# Patient Record
Sex: Male | Born: 1970 | Race: White | Hispanic: No | Marital: Married | Smoking: Never smoker
Health system: Southern US, Community
[De-identification: ages and names within clinical notes are randomized; demographics above are authoritative.]

## PROBLEM LIST (undated history)

## (undated) DIAGNOSIS — I7300 Raynaud's syndrome without gangrene: Secondary | ICD-10-CM

## (undated) DIAGNOSIS — I10 Essential (primary) hypertension: Secondary | ICD-10-CM

## (undated) DIAGNOSIS — I071 Rheumatic tricuspid insufficiency: Secondary | ICD-10-CM

## (undated) DIAGNOSIS — R002 Palpitations: Secondary | ICD-10-CM

## (undated) HISTORY — DX: Essential (primary) hypertension: I10

## (undated) HISTORY — DX: Palpitations: R00.2

## (undated) HISTORY — DX: Rheumatic tricuspid insufficiency: I07.1

## (undated) HISTORY — DX: Raynaud's syndrome without gangrene: I73.00

---
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
## 1999-01-20 ENCOUNTER — Emergency Department (HOSPITAL_COMMUNITY): Admission: EM | Admit: 1999-01-20 | Discharge: 1999-01-20 | Payer: Self-pay | Admitting: Emergency Medicine

## 2017-01-22 ENCOUNTER — Ambulatory Visit (INDEPENDENT_AMBULATORY_CARE_PROVIDER_SITE_OTHER): Payer: BLUE CROSS/BLUE SHIELD | Admitting: Physician Assistant

## 2017-01-22 ENCOUNTER — Encounter: Payer: Self-pay | Admitting: Physician Assistant

## 2017-01-22 VITALS — BP 138/90 | HR 80 | Temp 97.6°F | Ht 68.0 in | Wt 139.2 lb

## 2017-01-22 DIAGNOSIS — F418 Other specified anxiety disorders: Secondary | ICD-10-CM | POA: Diagnosis not present

## 2017-01-22 DIAGNOSIS — I10 Essential (primary) hypertension: Secondary | ICD-10-CM | POA: Insufficient documentation

## 2017-01-22 NOTE — Patient Instructions (Signed)
It was great meeting you today!  Please follow-up with me in two weeks, sooner if needed.  Go to the ER if you develop any chest pain or shortness of breath.   Hypertension Hypertension, commonly called high blood pressure, is when the force of blood pumping through your arteries is too strong. Your arteries are the blood vessels that carry blood from your heart throughout your body. A blood pressure reading consists of a higher number over a lower number, such as 110/72. The higher number (systolic) is the pressure inside your arteries when your heart pumps. The lower number (diastolic) is the pressure inside your arteries when your heart relaxes. Ideally you want your blood pressure below 120/80. Hypertension forces your heart to work harder to pump blood. Your arteries may become narrow or stiff. Having untreated or uncontrolled hypertension can cause heart attack, stroke, kidney disease, and other problems. What increases the risk? Some risk factors for high blood pressure are controllable. Others are not. Risk factors you cannot control include:  Race. You may be at higher risk if you are African American.  Age. Risk increases with age.  Gender. Men are at higher risk than women before age 85 years. After age 65, women are at higher risk than men. Risk factors you can control include:  Not getting enough exercise or physical activity.  Being overweight.  Getting too much fat, sugar, calories, or salt in your diet.  Drinking too much alcohol. What are the signs or symptoms? Hypertension does not usually cause signs or symptoms. Extremely high blood pressure (hypertensive crisis) may cause headache, anxiety, shortness of breath, and nosebleed. How is this diagnosed? To check if you have hypertension, your health care provider will measure your blood pressure while you are seated, with your arm held at the level of your heart. It should be measured at least twice using the same arm.  Certain conditions can cause a difference in blood pressure between your right and left arms. A blood pressure reading that is higher than normal on one occasion does not mean that you need treatment. If it is not clear whether you have high blood pressure, you may be asked to return on a different day to have your blood pressure checked again. Or, you may be asked to monitor your blood pressure at home for 1 or more weeks. How is this treated? Treating high blood pressure includes making lifestyle changes and possibly taking medicine. Living a healthy lifestyle can help lower high blood pressure. You may need to change some of your habits. Lifestyle changes may include:  Following the DASH diet. This diet is high in fruits, vegetables, and whole grains. It is low in salt, red meat, and added sugars.  Keep your sodium intake below 2,300 mg per day.  Getting at least 30-45 minutes of aerobic exercise at least 4 times per week.  Losing weight if necessary.  Not smoking.  Limiting alcoholic beverages.  Learning ways to reduce stress. Your health care provider may prescribe medicine if lifestyle changes are not enough to get your blood pressure under control, and if one of the following is true:  You are 53-27 years of age and your systolic blood pressure is above 140.  You are 38 years of age or older, and your systolic blood pressure is above 150.  Your diastolic blood pressure is above 90.  You have diabetes, and your systolic blood pressure is over 140 or your diastolic blood pressure is over 90.  You have kidney disease and your blood pressure is above 140/90.  You have heart disease and your blood pressure is above 140/90. Your personal target blood pressure may vary depending on your medical conditions, your age, and other factors. Follow these instructions at home:  Have your blood pressure rechecked as directed by your health care provider.  Take medicines only as directed by  your health care provider. Follow the directions carefully. Blood pressure medicines must be taken as prescribed. The medicine does not work as well when you skip doses. Skipping doses also puts you at risk for problems.  Do not smoke.  Monitor your blood pressure at home as directed by your health care provider. Contact a health care provider if:  You think you are having a reaction to medicines taken.  You have recurrent headaches or feel dizzy.  You have swelling in your ankles.  You have trouble with your vision. Get help right away if:  You develop a severe headache or confusion.  You have unusual weakness, numbness, or feel faint.  You have severe chest or abdominal pain.  You vomit repeatedly.  You have trouble breathing. This information is not intended to replace advice given to you by your health care provider. Make sure you discuss any questions you have with your health care provider.

## 2017-01-22 NOTE — Assessment & Plan Note (Signed)
Currently well controlled on Lisinopril 40 mg, which he recently started a few days ago. Requesting recent labs and EKG that were performed at [facility]. Follow-up with me in 2 weeks for follow-up of BP and physical. Will check labs at that time. Sooner if needed. Advised patient to ER if any chest pain or new/worrisome symptoms.

## 2017-01-22 NOTE — Progress Notes (Addendum)
Subjective:    Patient ID: Jonathan Mcguire, male    DOB: Apr 22, 1971, 46 y.o.   MRN: [id number]  HPI  Jonathan Mcguire is a 46 y/o male who presents to clinic today to establish care.  Acute Concerns: HTN - 3-weeks ago he noticed some episodes where he wasn't feeling well -- heart palpitations, lightheaded, dizziness went to walk-in clinic about 1 week ago, BP was 160/90, EKG and blood work was normal; he began to feel this way again a few days ago, and was started on Lisinopril 20 mg by a physician friend, he was told to increase to 40 mg if he still had symptoms which he did yesterday. He bought a blood pressure cuff and has been monitoring his Bp. Since he has been on the dose of 40mg  Lisinopril he has had well-controlled BP of <140/90.  He is with his wife today. 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 He does endorse a history of "white coat hypertension." He describes his sleep as "okay" he has always he has been a light sleeper.   Diet -- healthy and well-balanced diet, 1 cup of coffee daily Exercise -- walk 2-3 days, for about 2-2.5 hours Weight --   140 lb -- stable here Mood -- "concerned about my health"  He denies SOB, chest pain, headaches, lower leg swelling, poor appetite, sweating.  Review of Systems  See HPI  History reviewed. No pertinent past medical history.   Social History   Social History  . Marital status: Married    Spouse name: N/A  . Number of children: N/A  . Years of education: N/A   Occupational History  . Not on file.   Social History Main Topics  . Smoking status: Never Smoker  . Smokeless tobacco: Never Used  . Alcohol use 1.8 - 3.0 oz/week    2 - 3 Cans of beer, 1 - 2 Glasses of wine per week  . Drug use: No  . Sexual activity: Yes   Other Topics Concern  . Not on file   Social History Narrative  . No narrative on file    Past  Surgical History:  Procedure Laterality Date  . FRACTURE SURGERY Left 1987   Left leg, compound fx    Family History  Problem Relation Age of Onset  . Hypertension Mother   . Cancer Father   . Cancer Maternal Grandmother   . Heart disease Maternal Grandfather   . Stroke Maternal Grandfather     No Known Allergies  No current outpatient prescriptions on file prior to visit.   No current facility-administered medications on file prior to visit.     BP 138/90 (BP Location: Left Arm, Cuff Size: Normal)   Pulse 80   Temp 97.6 F (36.4 C) (Oral)   Ht 5\' 8"  (1.727 m)   Wt 139 lb 4 oz (63.2 kg)   SpO2 95%   BMI 21.17 kg/m      Objective:   Physical Exam  Constitutional: He appears well-developed and well-nourished. He is cooperative.  Non-toxic appearance. He does not have a sickly appearance. He does not appear ill. No distress.  HENT:  Head: Normocephalic and atraumatic.  Neck: Carotid bruit is not present.  Cardiovascular: Normal rate, regular rhythm, normal heart sounds and normal pulses.   Pulmonary/Chest: Effort normal. No accessory muscle usage. No respiratory distress.  Lymphadenopathy:  He has no cervical adenopathy.  Neurological: He is alert.  Skin: Skin is warm, dry and intact.  Nursing note and vitals reviewed.     Assessment & Plan:   Problem List Items Addressed This Visit      Cardiovascular and Mediastinum   Hypertension - Primary    Currently well controlled on Lisinopril 40 mg, which he recently started a few days ago. Requesting recent labs and EKG that were performed at [facility]. Follow-up with me in 2 weeks for follow-up of BP and physical. Will check labs at that time. Sooner if needed. Advised patient to ER if any chest pain or new/worrisome symptoms.      Relevant Medications   lisinopril (PRINIVIL,ZESTRIL) 20 MG tablet     Jarold MottoSamantha Arad Burston PA-C 01/22/17

## 2017-01-22 NOTE — Progress Notes (Signed)
Pre visit review using our clinic review tool, if applicable. No additional management support is needed unless otherwise documented below in the visit note. 

## 2017-01-25 ENCOUNTER — Encounter: Payer: Self-pay | Admitting: Physician Assistant

## 2017-01-25 ENCOUNTER — Ambulatory Visit: Payer: BLUE CROSS/BLUE SHIELD | Admitting: Physician Assistant

## 2017-01-25 ENCOUNTER — Emergency Department (HOSPITAL_COMMUNITY)
Admission: EM | Admit: 2017-01-25 | Discharge: 2017-01-25 | Disposition: A | Discharge status: Home / Self Care | Payer: BLUE CROSS/BLUE SHIELD | Attending: Emergency Medicine | Admitting: Emergency Medicine

## 2017-01-25 ENCOUNTER — Encounter (HOSPITAL_COMMUNITY): Payer: Self-pay | Admitting: Emergency Medicine

## 2017-01-25 ENCOUNTER — Emergency Department (HOSPITAL_COMMUNITY): Payer: BLUE CROSS/BLUE SHIELD

## 2017-01-25 VITALS — BP 160/94 | HR 114

## 2017-01-25 DIAGNOSIS — I10 Essential (primary) hypertension: Secondary | ICD-10-CM | POA: Diagnosis not present

## 2017-01-25 DIAGNOSIS — R000 Tachycardia, unspecified: Secondary | ICD-10-CM | POA: Diagnosis not present

## 2017-01-25 DIAGNOSIS — Z79899 Other long term (current) drug therapy: Secondary | ICD-10-CM | POA: Insufficient documentation

## 2017-01-25 LAB — CBC
HEMATOCRIT: 46.7 % (ref 39.0–52.0)
HEMOGLOBIN: 17.2 g/dL — AB (ref 13.0–17.0)
MCH: 29.1 pg (ref 26.0–34.0)
MCHC: 36.8 g/dL — ABNORMAL HIGH (ref 30.0–36.0)
MCV: 79 fL (ref 78.0–100.0)
Platelets: 217 10*3/uL (ref 150–400)
RBC: 5.91 MIL/uL — AB (ref 4.22–5.81)
RDW: 12.1 % (ref 11.5–15.5)
WBC: 7 10*3/uL (ref 4.0–10.5)

## 2017-01-25 LAB — BASIC METABOLIC PANEL
ANION GAP: 10 (ref 5–15)
BUN: 20 mg/dL (ref 6–20)
CO2: 22 mmol/L (ref 22–32)
Calcium: 9.9 mg/dL (ref 8.9–10.3)
Chloride: 103 mmol/L (ref 101–111)
Creatinine, Ser: 1.13 mg/dL (ref 0.61–1.24)
GFR calc Af Amer: >60 mL/min (ref >60)
Glucose, Bld: 116 mg/dL — ABNORMAL HIGH (ref 65–99)
POTASSIUM: 3.9 mmol/L (ref 3.5–5.1)
SODIUM: 135 mmol/L (ref 135–145)

## 2017-01-25 LAB — URINALYSIS, ROUTINE W REFLEX MICROSCOPIC
Bilirubin Urine: NEGATIVE
GLUCOSE, UA: NEGATIVE mg/dL
Hgb urine dipstick: NEGATIVE
KETONES UR: 5 mg/dL — AB
LEUKOCYTES UA: NEGATIVE
Nitrite: NEGATIVE
PROTEIN: NEGATIVE mg/dL
Specific Gravity, Urine: 1.019 (ref 1.005–1.030)
pH: 7 (ref 5.0–8.0)

## 2017-01-25 LAB — RAPID URINE DRUG SCREEN, HOSP PERFORMED
Amphetamines: NOT DETECTED
BENZODIAZEPINES: NOT DETECTED
Barbiturates: NOT DETECTED
Cocaine: NOT DETECTED
Opiates: NOT DETECTED
Tetrahydrocannabinol: NOT DETECTED

## 2017-01-25 LAB — I-STAT TROPONIN, ED: Troponin i, poc: 0 ng/mL (ref 0.00–0.08)

## 2017-01-25 LAB — TSH: TSH: 2.831 u[IU]/mL (ref 0.350–4.500)

## 2017-01-25 MED ORDER — METOPROLOL TARTRATE 5 MG/5ML IV SOLN
5.0000 mg | Freq: Once | INTRAVENOUS | Status: AC
  Administered 2017-01-25: 5 mg via INTRAVENOUS
  Filled 2017-01-25: qty 5

## 2017-01-25 MED ORDER — SODIUM CHLORIDE 0.9 % IV BOLUS (SEPSIS)
1000.0000 mL | Freq: Once | INTRAVENOUS | Status: AC
  Administered 2017-01-25: 1000 mL via INTRAVENOUS

## 2017-01-25 MED ORDER — METOPROLOL SUCCINATE ER 25 MG PO TB24: 25.0000 mg | ORAL_TABLET | Freq: Every day | ORAL | 0 refills | Status: DC

## 2017-01-25 NOTE — ED Provider Notes (Signed)
[facility]-EMERGENCY DEPT Provider Note   CSN: [id number] Arrival date & time: 01/25/17  1119     History   Chief Complaint Chief Complaint  Patient presents with  . Tachycardia    HPI Jonathan Mcguire is a 46 y.o. male.  Pt presents to the ED today with high blood pressure and tachycardia.  The pt said he started feeling bad about 1 week ago.  He was found to have elevated bp.  The pt was rx'd lisinopril 20 mg.  His bp remained elevated, so he increased the does to bid.  The pt had been doing well, then his bp elevated again.  He has also had rapid hr.  He said he had to leave work which is very unusual for him. Pt denied taking any otc meds.  Pt denies cp.      History reviewed. No pertinent past medical history.  Patient Active Problem List   Diagnosis Date Noted  . Hypertension 01/22/2017    Past Surgical History:  Procedure Laterality Date  . FRACTURE SURGERY Left 1987   Left leg, compound fx  . TONSILLECTOMY Bilateral 1980       Home Medications    Prior to Admission medications   Medication Sig Start Date End Date Taking? Authorizing Provider  lisinopril (PRINIVIL,ZESTRIL) 20 MG tablet  01/20/17   Historical Provider, MD  metoprolol succinate (TOPROL-XL) 25 MG 24 hr tablet Take 1 tablet (25 mg total) by mouth daily. 01/25/17   Jacalyn Lefevre, MD    Family History Family History  Problem Relation Age of Onset  . Hypertension Mother   . Cancer Father   . Cancer Maternal Grandmother   . Heart disease Maternal Grandfather   . Stroke Maternal Grandfather     Social History Social History  Substance Use Topics  . Smoking status: Never Smoker  . Smokeless tobacco: Never Used  . Alcohol use 1.8 - 3.0 oz/week    2 - 3 Cans of beer, 1 - 2 Glasses of wine per week     Allergies   Patient has no known allergies.   Review of Systems Review of Systems  Cardiovascular: Positive for palpitations.  All other systems reviewed and are negative.    Physical  Exam Updated Vital Signs BP 148/93 (BP Location: Left Arm)   Pulse 77   Temp 97.7 F (36.5 C) (Oral)   Resp 14   Ht 5\' 8"  (1.727 m)   Wt 140 lb (63.5 kg)   SpO2 100%   BMI 21.29 kg/m   Physical Exam  Constitutional: He is oriented to person, place, and time. He appears well-developed and well-nourished.  HENT:  Head: Normocephalic and atraumatic.  Right Ear: External ear normal.  Left Ear: External ear normal.  Nose: Nose normal.  Mouth/Throat: Oropharynx is clear and moist.  Eyes: Conjunctivae and EOM are normal. Pupils are equal, round, and reactive to light.  Neck: Normal range of motion. Neck supple.  Cardiovascular: Regular rhythm, normal heart sounds and intact distal pulses.  Tachycardia present.   Pulmonary/Chest: Effort normal and breath sounds normal.  Abdominal: Soft. Bowel sounds are normal.  Musculoskeletal: Normal range of motion.  Neurological: He is alert and oriented to person, place, and time.  Skin: Skin is warm.  Psychiatric: He has a normal mood and affect. His behavior is normal. Judgment and thought content normal.  Nursing note and vitals reviewed.    ED Treatments / Results  Labs (all labs ordered are listed, but only abnormal results  are displayed) Labs Reviewed  BASIC METABOLIC PANEL - Abnormal; Notable for the following:       Result Value   Glucose, Bld 116 (*)    All other components within normal limits  CBC - Abnormal; Notable for the following:    RBC 5.91 (*)    Hemoglobin 17.2 (*)    MCHC 36.8 (*)    All other components within normal limits  URINALYSIS, ROUTINE W REFLEX MICROSCOPIC - Abnormal; Notable for the following:    Ketones, ur 5 (*)    All other components within normal limits  TSH  RAPID URINE DRUG SCREEN, HOSP PERFORMED  I-STAT TROPOININ, ED    EKG  EKG Interpretation  Date/Time:  Thursday January 25 2017 11:34:17 EST Ventricular Rate:  116 PR Interval:    QRS Duration: 85 QT Interval:  317 QTC  Calculation: 441 R Axis:   68 Text Interpretation:  Sinus tachycardia Right atrial enlargement Probable anteroseptal infarct, old No old tracing to compare Confirmed by [facility] South[facility]ILAND MD, Donold Marotto [id number]) on 01/25/2017 12:45:17 PM       Radiology Dg Chest 2 View  Result Date: 01/25/2017 CLINICAL DATA:  Tachycardia. EXAM: CHEST  2 VIEW COMPARISON:  No recent prior . FINDINGS: Mediastinum hilar structures normal. Lungs are clear. Heart size normal. No pleural effusion or pneumothorax. No acute bony abnormality. IMPRESSION: No acute cardiopulmonary disease. Electronically Signed   By: Maisie Fushomas  Register   On: 01/25/2017 12:47    Procedures Procedures (including critical care time)  Medications Ordered in ED Medications  sodium chloride 0.9 % bolus 1,000 mL (1,000 mLs Intravenous New Bag/Given 01/25/17 1322)  metoprolol (LOPRESSOR) injection 5 mg (5 mg Intravenous Given 01/25/17 1318)     Initial Impression / Assessment and Plan / ED Course  I have reviewed the triage vital signs and the nursing notes.  Pertinent labs & imaging results that were available during my care of the patient were reviewed by me and considered in my medical decision making (see chart for details).     Pt responded to lopressor.  Pt d/w Trish (cardiology) who will set pt up with a Holter monitor.  I also spoke with Dr. Katrinka BlazingSmith (cardiology) who recommends pt f/u in the office.   Lisinopril to be stopped.  Lopressor to start.  Final Clinical Impressions(s) / ED Diagnoses   Final diagnoses:  Sinus tachycardia  Essential hypertension    New Prescriptions New Prescriptions   METOPROLOL SUCCINATE (TOPROL-XL) 25 MG 24 HR TABLET    Take 1 tablet (25 mg total) by mouth daily.     Jacalyn LefevreJulie Loanne Emery, MD 01/25/17 1600

## 2017-01-25 NOTE — Progress Notes (Signed)
Pt walked into the office with his wife c/o shakiness, tachycardia since last night was unable to sleep due to could not calm down. Pt took his Bp this AM and it was 175/104 and not sure if feeling tingling in left arm. Pt said did take medication Lisinopril 20 mg today. Pt looks anxious and shakey. Discussed pt with Lelon MastSamantha and Dr. Earlene PlaterWallace, verbal order to send pt to the ED to be evaluated. Went back in room told wife need to take pt to the ED to be evaluated. Pt and wife verbalized understanding and will go to ED now.

## 2017-01-25 NOTE — Discharge Instructions (Addendum)
Cardiology will call you with a time for Holter Monitor set up.  Stop Lisinopril.

## 2017-01-25 NOTE — ED Triage Notes (Signed)
Pt was sent from PCP for further evaluation. Pt c/o tachycardia and tingling in left arm. Pt was evaluated a month ago for same feeling and was found to have hypertension. Pt denies any chest pain or SOB. Pt started lisinopril 40mg  Saturday.

## 2017-01-30 ENCOUNTER — Encounter: Payer: Self-pay | Admitting: Nurse Practitioner

## 2017-01-30 ENCOUNTER — Ambulatory Visit (INDEPENDENT_AMBULATORY_CARE_PROVIDER_SITE_OTHER): Payer: BLUE CROSS/BLUE SHIELD

## 2017-01-30 ENCOUNTER — Encounter: Payer: Self-pay | Admitting: Physician Assistant

## 2017-01-30 ENCOUNTER — Ambulatory Visit (INDEPENDENT_AMBULATORY_CARE_PROVIDER_SITE_OTHER): Payer: BLUE CROSS/BLUE SHIELD | Admitting: Nurse Practitioner

## 2017-01-30 VITALS — BP 165/99 | HR 66 | Ht 68.0 in | Wt 139.0 lb

## 2017-01-30 DIAGNOSIS — I10 Essential (primary) hypertension: Secondary | ICD-10-CM

## 2017-01-30 DIAGNOSIS — R002 Palpitations: Secondary | ICD-10-CM

## 2017-01-30 NOTE — Progress Notes (Addendum)
Cardiology Clinic Note   Patient Name: Jonathan Mcguire Date of Encounter: 01/30/2017  Primary Care Provider:  Jarold Motto, PA Primary Cardiologist:  New - will f/u D. Herbie Baltimore, MD   Patient Profile    46 year old male with a history of presumed whitecoat HTN, who presents for evaluation related to recently noted elevated blood pressures and heart rates.  Past Medical History    Past Medical History:  Diagnosis Date  . Essential hypertension   . Palpitations    Past Surgical History:  Procedure Laterality Date  . FRACTURE SURGERY Left 1987   Left leg, compound fx  . TONSILLECTOMY Bilateral 1980    Allergies  No Known Allergies  History of Present Illness    46 year old male with a history of elevated blood pressures dating back at least 10 years. He had been on ARB therapy at one point, which was started by a primary care provider at the time. He did not feel particularly well on ARB therapy and after some time, a 24-hour ambulatory blood pressure monitor was placed and he was subsequently told that his blood pressure was normal and ARB therapy was discontinued. Since then, it has been pretty common for blood pressure to be elevated anytime they were checked in a doctor's office. He does sometimes check his blood pressures at home and had noted to be more normal. He lives locally with his wife and 3 children. 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 Since the beginning of 2018, his volume of work has picked up some. Over the past month, he has been noticing palpitations described as elevated heart rates and a tense feeling. When he has checked his blood pressures during these events, pressures been elevated in the 160s and 170s. He recently contacted a friend who works in family medicine and he was placed on lisinopril 20 mg daily. Pressures did not immediately improve and he was advised to increase to 40 mg daily. He  followed up with primary care in February 12, and blood pressure was well controlled.  Recommendation was made for follow-up in 2 weeks.  Unfortunately, on February 15, he again noted elevated heart rates and also hypertension. He presented back to primary care but was referred to the emergency department. There, he was in sinus tachycardia with a blood pressure of 160/94. Labs were otherwise unrevealing including CBC, basic metabolic panel, and TSH. He was switched off of lisinopril and onto Toprol-XL 25 mg daily. Since discharge from the ED, he has not checked his blood pressure. He has noted occasional lightheadedness but has not noticed any significant palpitations or tachycardia.  Home Medications    Prior to Admission medications   Medication Sig Start Date End Date Taking? Authorizing Provider  metoprolol succinate (TOPROL-XL) 25 MG 24 hr tablet Take 1 tablet (25 mg total) by mouth daily. 01/25/17  Yes Jacalyn Lefevre, MD    Family History    Family History  Problem Relation Age of Onset  . Hypertension Mother   . Cancer Father   . Cancer Maternal Grandmother   . Heart disease Maternal Grandfather   . Stroke Maternal Grandfather     Social History    Social History   Social History  . Marital status: Married    Spouse name: N/A  . Number of children: N/A  . Years of education: N/A   Occupational History  . Not on file.   Social History  Main Topics  . Smoking status: Never Smoker  . Smokeless tobacco: Never Used  . Alcohol use 1.8 - 3.0 oz/week    2 - 3 Cans of beer, 1 - 2 Glasses of wine per week  . Drug use: No  . Sexual activity: Yes   Other Topics Concern  . Not on file   Social History Narrative   Lives in [place] with wife and three children.  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     Review of Systems    General:  No chills, fever, night sweats or weight changes.  Cardiovascular:  No chest pain, dyspnea on exertion,  edema, orthopnea, +++ palpitations, paroxysmal nocturnal dyspnea. +++ lightheadedness. Dermatological: No rash, lesions/masses Respiratory: No cough, dyspnea Urologic: No hematuria, dysuria Abdominal:   No nausea, vomiting, diarrhea, bright red blood per rectum, melena, or hematemesis Neurologic:  No visual changes, wkns, changes in mental status. All other systems reviewed and are otherwise negative except as noted above.  Physical Exam    VS:  BP (!) 165/99   Pulse 66   Ht 5\' 8"  (1.727 m)   Wt 139 lb (63 kg)   BMI 21.13 kg/m  , BMI Body mass index is 21.13 kg/m.   Repeat blood pressure 130/90 GEN: Well nourished, well developed, in no acute distress.  HEENT: normal.  Neck: Supple, no JVD, carotid bruits, or masses. Cardiac: RRR, no murmurs, rubs, or gallops. No clubbing, cyanosis, edema.  Radials/DP/PT 2+ and equal bilaterally.  Respiratory:  Respirations regular and unlabored, clear to auscultation bilaterally. GI: Soft, nontender, nondistended, BS + x 4. MS: no deformity or atrophy. Skin: warm and dry, no rash. Neuro:  Strength and sensation are intact. Psych: Normal affect.  Accessory Clinical Findings    ECG - Regular sinus rhythm, 66, no acute ST or T changes. - Personally reviewed  Lab Results  Component Value Date   WBC 7.0 01/25/2017   HGB 17.2 (H) 01/25/2017   HCT 46.7 01/25/2017   MCV 79.0 01/25/2017   PLT 217 01/25/2017   Lab Results  Component Value Date   CREATININE 1.13 01/25/2017   BUN 20 01/25/2017   NA 135 01/25/2017   K 3.9 01/25/2017   CL 103 01/25/2017   CO2 22 01/25/2017   Lab Results  Component Value Date   TSH 2.831 01/25/2017     Assessment & Plan   1.  Tachycpalpitations: Patient presents with a one-month history of intermittent elevated heart rates and associated elevated blood pressures. On one occasion, he was documented to have sinus tachycardia with normal CBC, basic metabolic panel, and TSH in the ER on February 15. He admits  to some anxiety related to noticing elevated blood pressures recently, and this may be driving his heart rate. He notes that there is somewhat of a sudden onset to the elevated rates and palpitations. In that setting, I will place an event monitor to link symptoms with rhythm and hopefully rule out any significant arrhythmias. I will also obtain an echo to evaluate LV function and rule out structural abnormalities. I suspect both may provide quite a bit of reassurance if normal.  If we are able to document elevations in HR and BP in the absence of anxiety/work-life stress, I would have a low threshold to collect a 24 hr urine for catecholamines to r/o pheo.  2. Essential hypertension: As above, blood pressures have been elevated intermittently for 10 years however, this was chalked  up to white coat hypertension. I did repeat his blood pressure today was 130/90 after talking for nearly 45 minutes. He has been taking Toprol-XL 25 mg daily. He has had mild lightheadedness but overall is tolerating this. He does have a blood pressure cuff at home. I recommended he follow his blood pressure at home daily and contact us if pressure is continuing to trend over 130. In that setting, I would add back lisinopril 10 mg daily for starters and subsequently titrate from there. I would not push his beta blocker any further in the setting of a heart rate of 66 at rest. If monitoring fails to show any significant arrhythmia, we could consider discontinuation of beta blocker therapy in this young gentleman who may be prone to fatigue as result of it. We talked at length about lifestyle modification. He is actually already fairly active guy who is of normal weight and does not smoke or drink excessively. His wife prepares the vast majority of their meals at home and they're very careful with sodium intake and generally try to eat healthfully.  3. Disposition: He will contact us if blood pressures remain elevated despite beta  blocker therapy at which point, I would plan to add back lisinopril 10 mg daily. He is followed with primary care next week. I will plan to see him back in about a month to review his monitor and echo.  Nicolasa Duckinghristopher Mario Voong, NP 01/30/2017, 1:13 PM

## 2017-01-30 NOTE — Patient Instructions (Addendum)
Medication Instructions: Jonathan Givenshris Berge, NP recommends that you continue on your current medications as directed. Please refer to the Current Medication list given to you today.  Labwork: NONE ORDERED  Testing/Procedures: 1. Echocardiogram - Your physician has requested that you have an echocardiogram. Echocardiography is a painless test that uses sound waves to create images of your heart. It provides your doctor with information about the size and shape of your heart and how well your heart's chambers and valves are working. This procedure takes approximately one hour. There are no restrictions for this procedure. [facility] [address], [address].  2. 30-Day Cardiac Event Monitor - Your physician has recommended that you wear an event monitor. Event monitors are medical devices that record the heart's electrical activity. Doctors most often us these monitors to diagnose arrhythmias. Arrhythmias are problems with the speed or rhythm of the heartbeat. The monitor is a small, portable device. You can wear one while you do your normal daily activities. This is usually used to diagnose what is causing palpitations/syncope (passing out).  Follow-up: Your physician recommends that you schedule a follow-up appointment in 1 month with Jonathan Givenshris Berge, NP.  If you need a refill on your cardiac medications before your next appointment, please call your pharmacy.   Your physician has requested that you regularly monitor and record your blood pressure readings at home. Please use the same machine at the same time of day to check your readings and record them to bring to your follow-up visit.

## 2017-02-05 ENCOUNTER — Ambulatory Visit (INDEPENDENT_AMBULATORY_CARE_PROVIDER_SITE_OTHER): Payer: BLUE CROSS/BLUE SHIELD | Admitting: Physician Assistant

## 2017-02-05 ENCOUNTER — Encounter: Payer: Self-pay | Admitting: Physician Assistant

## 2017-02-05 ENCOUNTER — Telehealth: Payer: Self-pay | Admitting: Nurse Practitioner

## 2017-02-05 VITALS — BP 148/90 | HR 63 | Temp 98.1°F | Ht 68.0 in | Wt 139.0 lb

## 2017-02-05 DIAGNOSIS — Z7189 Other specified counseling: Secondary | ICD-10-CM | POA: Diagnosis not present

## 2017-02-05 DIAGNOSIS — Z0001 Encounter for general adult medical examination with abnormal findings: Secondary | ICD-10-CM

## 2017-02-05 DIAGNOSIS — I10 Essential (primary) hypertension: Secondary | ICD-10-CM

## 2017-02-05 LAB — COMPREHENSIVE METABOLIC PANEL
ALT: 18 U/L (ref 0–53)
AST: 21 U/L (ref 0–37)
Albumin: 4.7 g/dL (ref 3.5–5.2)
Alkaline Phosphatase: 34 U/L — ABNORMAL LOW (ref 39–117)
BILIRUBIN TOTAL: 1 mg/dL (ref 0.2–1.2)
BUN: 16 mg/dL (ref 6–23)
CALCIUM: 9.4 mg/dL (ref 8.4–10.5)
CO2: 29 meq/L (ref 19–32)
CREATININE: 1.09 mg/dL (ref 0.40–1.50)
Chloride: 103 mEq/L (ref 96–112)
GFR: 77.74 mL/min (ref >60.00)
Glucose, Bld: 92 mg/dL (ref 70–99)
Potassium: 3.9 mEq/L (ref 3.5–5.1)
Sodium: 139 mEq/L (ref 135–145)
Total Protein: 7.3 g/dL (ref 6.0–8.3)

## 2017-02-05 LAB — CBC WITH DIFFERENTIAL/PLATELET
Basophils Absolute: 0 10*3/uL (ref 0.0–0.1)
Basophils Relative: 0.7 % (ref 0.0–3.0)
EOS ABS: 0.2 10*3/uL (ref 0.0–0.7)
Eosinophils Relative: 3.6 % (ref 0.0–5.0)
HEMATOCRIT: 44.8 % (ref 39.0–52.0)
HEMOGLOBIN: 15.5 g/dL (ref 13.0–17.0)
LYMPHS PCT: 33 % (ref 12.0–46.0)
Lymphs Abs: 1.7 10*3/uL (ref 0.7–4.0)
MCHC: 34.7 g/dL (ref 30.0–36.0)
MCV: 84.8 fl (ref 78.0–100.0)
Monocytes Absolute: 0.4 10*3/uL (ref 0.1–1.0)
Monocytes Relative: 7.7 % (ref 3.0–12.0)
Neutro Abs: 2.8 10*3/uL (ref 1.4–7.7)
Neutrophils Relative %: 55 % (ref 43.0–77.0)
PLATELETS: 196 10*3/uL (ref 150.0–400.0)
RBC: 5.28 Mil/uL (ref 4.22–5.81)
RDW: 12.9 % (ref 11.5–15.5)
WBC: 5.1 10*3/uL (ref 4.0–10.5)

## 2017-02-05 LAB — LIPID PANEL
CHOL/HDL RATIO: 3
Cholesterol: 187 mg/dL (ref 0–200)
HDL: 71.7 mg/dL (ref >39.00)
LDL Cholesterol: 103 mg/dL — ABNORMAL HIGH (ref 0–99)
NONHDL: 115.21
TRIGLYCERIDES: 60 mg/dL (ref 0.0–149.0)
VLDL: 12 mg/dL (ref 0.0–40.0)

## 2017-02-05 MED ORDER — LISINOPRIL 10 MG PO TABS: 10.0000 mg | ORAL_TABLET | Freq: Every day | ORAL | 3 refills | Status: DC

## 2017-02-05 NOTE — Progress Notes (Signed)
Pre visit review using our clinic review tool, if applicable. No additional management support is needed unless otherwise documented below in the visit note. 

## 2017-02-05 NOTE — Patient Instructions (Addendum)
It was great seeing you today!  Please stop by the lab on the way out for your routine, we will call you with your results when we get them. Please call cardiology and discuss your blood pressure readings with them for further recommendations. You will be contacted about your referral to urology.  Health Maintenance, Male A healthy lifestyle and preventative care can promote health and wellness.  Maintain regular health, dental, and eye exams.  Eat a healthy diet. Foods like vegetables, fruits, whole grains, low-fat dairy products, and lean protein foods contain the nutrients you need and are low in calories. Decrease your intake of foods high in solid fats, added sugars, and salt. Get information about a proper diet from your health care provider, if necessary.  Regular physical exercise is one of the most important things you can do for your health. Most adults should get at least 150 minutes of moderate-intensity exercise (any activity that increases your heart rate and causes you to sweat) each week. In addition, most adults need muscle-strengthening exercises on 2 or more days a week.   Maintain a healthy weight. The body mass index (BMI) is a screening tool to identify possible weight problems. It provides an estimate of body fat based on height and weight. Your health care provider can find your BMI and can help you achieve or maintain a healthy weight. For males 20 years and older:  A BMI below 18.5 is considered underweight.  A BMI of 18.5 to 24.9 is normal.  A BMI of 25 to 29.9 is considered overweight.  A BMI of 30 and above is considered obese.  Maintain normal blood lipids and cholesterol by exercising and minimizing your intake of saturated fat. Eat a balanced diet with plenty of fruits and vegetables. Blood tests for lipids and cholesterol should begin at age 33 and be repeated every 5 years. If your lipid or cholesterol levels are high, you are over age 20, or you are at  high risk for heart disease, you may need your cholesterol levels checked more frequently.Ongoing high lipid and cholesterol levels should be treated with medicines if diet and exercise are not working.  If you smoke, find out from your health care provider how to quit. If you do not use tobacco, do not start.  Lung cancer screening is recommended for adults aged 55-80 years who are at high risk for developing lung cancer because of a history of smoking. A yearly low-dose CT scan of the lungs is recommended for people who have at least a 30-pack-year history of smoking and are current smokers or have quit within the past 15 years. A pack year of smoking is smoking an average of 1 pack of cigarettes a day for 1 year (for example, a 30-pack-year history of smoking could mean smoking 1 pack a day for 30 years or 2 packs a day for 15 years). Yearly screening should continue until the smoker has stopped smoking for at least 15 years. Yearly screening should be stopped for people who develop a health problem that would prevent them from having lung cancer treatment.  If you choose to drink alcohol, do not have more than 2 drinks per day. One drink is considered to be 12 oz (360 mL) of beer, 5 oz (150 mL) of wine, or 1.5 oz (45 mL) of liquor.  Avoid the use of street drugs. Do not share needles with anyone. Ask for help if you need support or instructions about stopping the use  of drugs.  High blood pressure causes heart disease and increases the risk of stroke. High blood pressure is more likely to develop in:  People who have blood pressure in the end of the normal range (100-139/85-89 mm Hg).  People who are overweight or obese.  People who are African American.  If you are 5318-45 years of age, have your blood pressure checked every 3-5 years. If you are 45 years of age or older, have your blood pressure checked every year. You should have your blood pressure measured twice-once when you are at a  hospital or clinic, and once when you are not at a hospital or clinic. Record the average of the two measurements. To check your blood pressure when you are not at a hospital or clinic, you can use:  An automated blood pressure machine at a pharmacy.  A home blood pressure monitor.  If you are 7845-45 years old, ask your health care provider if you should take aspirin to prevent heart disease.  Diabetes screening involves taking a blood sample to check your fasting blood sugar level. This should be done once every 3 years after age 45 if you are at a normal weight and without risk factors for diabetes. Testing should be considered at a younger age or be carried out more frequently if you are overweight and have at least 1 risk factor for diabetes.  Colorectal cancer can be detected and often prevented. Most routine colorectal cancer screening begins at the age of 45 and continues through age 45. However, your health care provider may recommend screening at an earlier age if you have risk factors for colon cancer. On a yearly basis, your health care provider may provide home test kits to check for hidden blood in the stool. A small camera at the end of a tube may be used to directly examine the colon (sigmoidoscopy or colonoscopy) to detect the earliest forms of colorectal cancer. Talk to your health care provider about this at age 45 when routine screening begins. A direct exam of the colon should be repeated every 5-10 years through age 45, unless early forms of precancerous polyps or small growths are found.  People who are at an increased risk for hepatitis B should be screened for this virus. You are considered at high risk for hepatitis B if:  You were born in a country where hepatitis B occurs often. Talk with your health care provider about which countries are considered high risk.  Your parents were born in a high-risk country and you have not received a shot to protect against hepatitis B  (hepatitis B vaccine).  You have HIV or AIDS.  You use needles to inject street drugs.  You live with, or have sex with, someone who has hepatitis B.  You are a man who has sex with other men (MSM).  You get hemodialysis treatment.  You take certain medicines for conditions like cancer, organ transplantation, and autoimmune conditions.  Hepatitis C blood testing is recommended for all people born from [address]1944 through 1964 and any individual with known risk factors for hepatitis C.  Healthy men should no longer receive prostate-specific antigen (PSA) blood tests as part of routine cancer screening. Talk to your health care provider about prostate cancer screening.  Testicular cancer screening is not recommended for adolescents or adult males who have no symptoms. Screening includes self-exam, a health care provider exam, and other screening tests. Consult with your health care provider about any symptoms you  have or any concerns you have about testicular cancer.  Practice safe sex. Use condoms and avoid high-risk sexual practices to reduce the spread of sexually transmitted infections (STIs).  You should be screened for STIs, including gonorrhea and chlamydia if:  You are sexually active and are younger than 24 years.  You are older than 24 years, and your health care provider tells you that you are at risk for this type of infection.  Your sexual activity has changed since you were last screened, and you are at an increased risk for chlamydia or gonorrhea. Ask your health care provider if you are at risk.  If you are at risk of being infected with HIV, it is recommended that you take a prescription medicine daily to prevent HIV infection. This is called pre-exposure prophylaxis (PrEP). You are considered at risk if:  You are a man who has sex with other men (MSM).  You are a heterosexual man who is sexually active with multiple partners.  You take drugs by injection.  You are  sexually active with a partner who has HIV.  Talk with your health care provider about whether you are at high risk of being infected with HIV. If you choose to begin PrEP, you should first be tested for HIV. You should then be tested every 3 months for as long as you are taking PrEP.  Use sunscreen. Apply sunscreen liberally and repeatedly throughout the day. You should seek shade when your shadow is shorter than you. Protect yourself by wearing long sleeves, pants, a wide-brimmed hat, and sunglasses year round whenever you are outdoors.  Tell your health care provider of new moles or changes in moles, especially if there is a change in shape or color. Also, tell your health care provider if a mole is larger than the size of a pencil eraser.  A one-time screening for abdominal aortic aneurysm (AAA) and surgical repair of large AAAs by ultrasound is recommended for men aged 65-75 years who are current or former smokers.  Stay current with your vaccines (immunizations). This information is not intended to replace advice given to you by your health care provider. Make sure you discuss any questions you have with your health care provider. Document Released: 05/25/2008 Document Revised: 12/18/2014 Document Reviewed: 08/31/2015 Elsevier Interactive Patient Education  2017 [employer].

## 2017-02-05 NOTE — Assessment & Plan Note (Signed)
Followed by cardiology. Currently maintained on 25 mg Toprol Xl. Event monitor in place. Echo pending. I encouraged patient to call cardiology with blood pressure readings, per recommendation from cardiology's note, patient may need to start Lisinopril 10mg . 02/05/17

## 2017-02-05 NOTE — Progress Notes (Addendum)
Subjective:    Patient ID: Jonathan Mcguire, male    DOB: 02/26/1971, 46 y.o.   MRN: [id number]  HPI  Jonathan Mcguire is a 46 y/o male who presents to clinic today to for physical exam.  Acute Concerns: HTN - Patient went to the emergency department on 01/25/17 and was diagnosed with sinus tachycardia. Prior to his ED visit, he was taking 40 mg Lisinopril. During the his emergency department visit, his labs were unrevealing -- including normal Troponin, TSH, urinalysis, urine drug screen. BMP was normal except for slightly elevated glucose of 116. His lisinopril was changed to Toprol-XL 25mg  .  He was seen by Jonathan Ducking, NP on 01/30/17 and patient was put on a 30-day event monitor and an echo was scheduled for early March. He has not had any events that he has documented on his monitor since it was placed. Denies chest pain, SOB, dizziness, lower leg swelling. Vasectomy - Patient is interested in talking to urologist about vasectomy, as well as he has noticed that it is taking him longer to empty his bladder. Denies fevers, chills, back pain, burning with urination.  Health Maintenance: Immunizations -- declines flu shot today Diet -- wife prepares all meals, very rarely eats out Exercise -- golfs, yard work, walking 2-3 miles most days of the week at a good  Weight -- Weight: 139 lb (63 kg)  Mood -- much less anxiety, taking some half days; very positive and upbeat Caffeine -- 1 cup of coffee daily  Other providers/specialists: Jonathan Mcguire - NP -- cardiology Dentist Eye doctor  Review of Systems  Constitutional: Negative for chills, diaphoresis, fatigue and fever.  HENT: Negative for hearing loss, sinus pain and sore throat.   Eyes: Negative for photophobia and visual disturbance.  Respiratory: Negative for cough, choking, chest tightness, shortness of breath and stridor.   Cardiovascular: Negative for chest pain and leg swelling.  Gastrointestinal: Negative for abdominal pain,  constipation, diarrhea, nausea and vomiting.  Endocrine: Negative for polydipsia and polyphagia.  Genitourinary: Negative for discharge, dysuria, flank pain, penile pain, penile swelling, testicular pain and urgency.       Taking longer time to empty his bladder   Musculoskeletal: Negative for arthralgias, back pain and gait problem.  Skin: Negative for pallor and rash.  Neurological: Negative for dizziness, seizures, speech difficulty, light-headedness and numbness.  Hematological: Negative for adenopathy.  Psychiatric/Behavioral:       Denies anxiety or depression      Past Medical History:  Diagnosis Date  . Essential hypertension   . Palpitations      Social History   Social History  . Marital status: Married    Spouse name: N/A  . Number of children: N/A  . Years of education: N/A   Occupational History  . Not on file.   Social History Main Topics  . Smoking status: Never Smoker  . Smokeless tobacco: Never Used  . Alcohol use 1.8 - 3.0 oz/week    2 - 3 Cans of beer, 1 - 2 Glasses of wine per week  . Drug use: No  . Sexual activity: Yes   Other Topics Concern  . Not on file   Social History Narrative   Lives in [place] with wife and three children.  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    Past Surgical History:  Procedure Laterality Date  . FRACTURE SURGERY Left 1987   Left leg, compound fx  . TONSILLECTOMY Bilateral  1980    Family History  Problem Relation Age of Onset  . Hypertension Mother   . Cancer Father   . Cancer Maternal Grandmother   . Heart disease Maternal Grandfather   . Stroke Maternal Grandfather     No Known Allergies  Current Outpatient Prescriptions on File Prior to Visit  Medication Sig Dispense Refill  . metoprolol succinate (TOPROL-XL) 25 MG 24 hr tablet Take 1 tablet (25 mg total) by mouth daily. 30 tablet 0   No current facility-administered medications on file prior to visit.      BP (!) 148/90 (BP Location: Left Arm, Patient Position: Sitting, Cuff Size: Normal)   Pulse 63   Temp 98.1 F (36.7 C) (Oral)   Ht 5\' 8"  (1.727 m)   Wt 139 lb (63 kg)   SpO2 98%   BMI 21.13 kg/m      Objective:   Physical Exam  Constitutional: He is oriented to person, place, and time. Vital signs are normal. He appears well-developed and well-nourished.  Non-toxic appearance. He does not have a sickly appearance. He does not appear ill. No distress.  HENT:  Head: Normocephalic and atraumatic.  Right Ear: Tympanic membrane, external ear and ear canal normal. Tympanic membrane is not erythematous, not retracted and not bulging.  Left Ear: Tympanic membrane, external ear and ear canal normal. Tympanic membrane is not erythematous, not retracted and not bulging.  Nose: Nose normal.  Mouth/Throat: Uvula is midline. No posterior oropharyngeal edema or posterior oropharyngeal erythema.  Eyes: Conjunctivae and EOM are normal. Pupils are equal, round, and reactive to light.  Neck: Normal range of motion. Neck supple.  Cardiovascular: Normal rate, regular rhythm, normal heart sounds and intact distal pulses.   Holter monitor in place  Pulmonary/Chest: Effort normal and breath sounds normal.  Abdominal: Soft. Normal appearance and bowel sounds are normal. He exhibits no distension and no mass. There is no tenderness. There is no rebound and no guarding.  Genitourinary:  Genitourinary Comments: Deferred, urology referral  Musculoskeletal: Normal range of motion.  Lymphadenopathy:    He has no cervical adenopathy.  Neurological: He is alert and oriented to person, place, and time. He has normal reflexes. No cranial nerve deficit. Coordination normal.  Skin: Skin is warm, dry and intact.  Psychiatric: He has a normal mood and affect. His behavior is normal. Judgment and thought content normal.  Nursing note and vitals reviewed.      Assessment & Plan:  Encounter for general adult  medical examination with abnormal findings Today patient counseled on age appropriate routine health concerns for screening and prevention, each reviewed and up to date or declined. Immunizations reviewed and up to date or declined. Labs ordered and reviewed. Risk factors for depression reviewed and negative. Hearing function and visual acuity are intact. ADLs screened and addressed as needed. Functional ability and level of safety reviewed and appropriate. Education, counseling and referrals performed based on assessed risks today. Patient provided with a copy of personalized plan for preventive services. - CBC with Differential/Platelet - Comprehensive metabolic panel - Lipid panel - Ambulatory referral to Urology --> discuss vasectomy and evaluate "longer time emptying bladder"  Patient declined HIV screening and influenza shot today.  ASCVD: 1.7%  Problem List Items Addressed This Visit      Cardiovascular and Mediastinum   Hypertension    Followed by cardiology. Currently maintained on 25 mg Toprol Xl. Event monitor in place. Echo pending. I encouraged patient to call cardiology with blood pressure  readings, per recommendation from cardiology's note, patient may need to start Lisinopril 10mg . 02/05/17        Other Visit Diagnoses    Encounter for general adult medical examination with abnormal findings    -  Primary   Relevant Orders   CBC with Differential/Platelet   Comprehensive metabolic panel   Lipid panel   Ambulatory referral to Urology   Advance directive discussed with patient         Jarold MottoSamantha Shepherd Finnan PA-C 02/05/17

## 2017-02-05 NOTE — Telephone Encounter (Signed)
Pt notified, rx sent to requested [facility] as in Epic.

## 2017-02-05 NOTE — Progress Notes (Signed)
Spent 19 minutes discussing the following topics regarding Advance Directives:  -Goals of care  -Code status: Difference between a full code, limited code (MOST form), and DNR -Difference between hospice and palliative care -Living Will  -Reason and significance for power of attorney -Reviewed each page in Advance Directive packet and answered questions by patient  Patient had no questions after review of advance directive. Patient states he plans to have discussion with wife and will follow-up if questions were to arise. Patient also disclosed he already has a living will but does not cover topics reviewed [facility]. Patient had no cognitive impairments and was alert and oriented x4 for duration of meeting.   No paperwork was completed during this visit.

## 2017-02-05 NOTE — Telephone Encounter (Signed)
New Message    Pt c/o BP issue: STAT if pt c/o blurred vision, one-sided weakness or slurred speech  1. What are your last 5 BP readings? 138/87, 153/93, 154/83  2. Are you having any other symptoms (ex. Dizziness, headache, blurred vision, passed out)? Little light headed  3. What is your BP issue? Per pt was told to call in to report BP if no change had occurred. Requesting call back

## 2017-02-05 NOTE — Telephone Encounter (Signed)
As he and I discussed, given ongoing elevations in blood pressure, I will resume lisinopril 10 mg PO Daily.  He likely needs a Rx for this.  Since we're starting an acei, he will need f/u bmet in a week.

## 2017-02-05 NOTE — Telephone Encounter (Signed)
Spoke with pt states that he has still been having high BP  Sin his last visit and was calling to let Jonathan Mcguire know. He states that he has been feeling light headed or foggy intermittently through-out the day, he staes that he does not know if this is related to hid BP or not. Pt's BP has been running 138/87, 153/93, 154/83, 151/90, 135/84, 149/90 and HR ranging 58-70. Pt states that his BP is trending lower in the am and higher in the pm. avg in the am=148/85 and pm=156/95. Please advise

## 2017-02-16 ENCOUNTER — Other Ambulatory Visit: Payer: Self-pay

## 2017-02-16 ENCOUNTER — Other Ambulatory Visit: Payer: Self-pay | Admitting: Nurse Practitioner

## 2017-02-16 ENCOUNTER — Ambulatory Visit (HOSPITAL_COMMUNITY): Payer: BLUE CROSS/BLUE SHIELD | Attending: Internal Medicine

## 2017-02-16 ENCOUNTER — Other Ambulatory Visit: Payer: Self-pay | Admitting: Pharmacist Clinician (PhC)/ Clinical Pharmacy Specialist

## 2017-02-16 DIAGNOSIS — I361 Nonrheumatic tricuspid (valve) insufficiency: Secondary | ICD-10-CM | POA: Diagnosis not present

## 2017-02-16 DIAGNOSIS — R002 Palpitations: Secondary | ICD-10-CM | POA: Diagnosis not present

## 2017-02-16 DIAGNOSIS — I10 Essential (primary) hypertension: Secondary | ICD-10-CM

## 2017-02-16 LAB — BASIC METABOLIC PANEL
BUN: 15 mg/dL (ref 7–25)
CALCIUM: 9.7 mg/dL (ref 8.6–10.3)
CHLORIDE: 104 mmol/L (ref 98–110)
CO2: 27 mmol/L (ref 20–31)
Creat: 1.13 mg/dL (ref 0.60–1.35)
GLUCOSE: 93 mg/dL (ref 65–99)
Potassium: 4.9 mmol/L (ref 3.5–5.3)
SODIUM: 139 mmol/L (ref 135–146)

## 2017-02-22 ENCOUNTER — Ambulatory Visit (INDEPENDENT_AMBULATORY_CARE_PROVIDER_SITE_OTHER): Payer: BLUE CROSS/BLUE SHIELD | Admitting: Nurse Practitioner

## 2017-02-22 ENCOUNTER — Encounter: Payer: Self-pay | Admitting: Nurse Practitioner

## 2017-02-22 VITALS — BP 155/90 | HR 77 | Ht 68.0 in | Wt 141.0 lb

## 2017-02-22 DIAGNOSIS — I10 Essential (primary) hypertension: Secondary | ICD-10-CM

## 2017-02-22 DIAGNOSIS — R002 Palpitations: Secondary | ICD-10-CM | POA: Diagnosis not present

## 2017-02-22 MED ORDER — LISINOPRIL 20 MG PO TABS: 20.0000 mg | ORAL_TABLET | Freq: Every day | ORAL | 3 refills | Status: DC

## 2017-02-22 NOTE — Patient Instructions (Signed)
Cut your metoprolol dose in half for the next 3 days, then after 3rd day stop this medication.  Increase your lisinopril to 20mg  daily.  Follow up with us as needed.

## 2017-02-22 NOTE — Progress Notes (Addendum)
Office Visit    Patient Name: Jonathan Mcguire Date of Encounter: 02/23/2017  Primary Care Provider:  Jarold Motto, PA Primary Cardiologist:  Ranae Palms, MD   Chief Complaint    46 year old male with a prior history of hypertension and elevated heart rates who presents for follow-up.  Past Medical History    Past Medical History:  Diagnosis Date  . Ebstein-like anomaly    a. 02/2017 Echo: EF 60-65%, no rwma, exaggerated AV valve offset of 1.7 cm suggestive of mild Ebstein anomaly variant w/ nl tricuspid leaflet motion, nl LA/RA size, nl RV fxn, mild TR.  Marland Kitchen Palpitations   . Raynaud disease    Past Surgical History:  Procedure Laterality Date  . FRACTURE SURGERY Left 1987   Left leg, compound fx  . TONSILLECTOMY Bilateral 1980    Allergies  No Known Allergies  History of Present Illness    46 year old male with a history of hypertension who was recently seen in the emergency department in February with palpitations, elevated heart rates, and elevated blood pressure. Evaluation in the ER was notable for sinus tachycardia and elevated blood pressure. He was started on metoprolol therapy. I saw him in clinic following his ER visit and ordered an event monitor and also an echo. He called [place] within a week of that visit to report that his blood pressures were elevated and we resumed lisinopril 10 mg daily, which he had been on prior to the ER visit. Since then, his blood pressures have mostly been in the 1:30 systolic. He says that over the past 3 weeks, he has been exercising more and running some. He does watch his diet and has always been in reasonably good shape. He has not had any limitations in his activities and denies chest pain or dyspnea. Up to this point, his event monitor has not shown [place] any significant arrhythmias. He says he will still occasionally have spells where he feels as though his blood pressure is elevating. This occurred as recently as last night and he  checked his blood pressure and heart rate during that time and his pressure was in the 130s with a heart rate in the 60s. Of note, echocardiogram was performed last week and showed normal LV function with exaggerated AV valve offset of 1.7 cm suggested mild Ebstein anomaly variant with normal tricuspid leaflet motion. Echo was otherwise normal limits no other evidence of congenital anomalies.  Home Medications    Prior to Admission medications   Medication Sig Start Date End Date Taking? Authorizing Provider  lisinopril (PRINIVIL,ZESTRIL) 20 MG tablet Take 1 tablet (20 mg total) by mouth daily. 02/22/17 05/23/17 Yes Ok Anis, NP    Review of Systems    Since his last visit, he has overall done quite well without limitations and activities. He has not been having any chest pain or dyspnea. He denies lightheadedness, which was a symptom prior to his last visit. He still occasionally has surges of feeling as though his blood pressure or heart rate are elevated though no objective evidence of this.  All other systems reviewed and are otherwise negative except as noted above.  Physical Exam    VS:  BP (!) 155/90   Pulse 77   Ht 5\' 8"  (1.727 m)   Wt 141 lb (64 kg)   BMI 21.44 kg/m  , BMI Body mass index is 21.44 kg/m. GEN: Well nourished, well developed, in no acute distress.  HEENT: normal.  Neck: Supple, no JVD,  carotid bruits, or masses. Cardiac: RRR, no murmurs, rubs, or gallops. No clubbing, cyanosis, edema.  Radials/DP/PT 2+ and equal bilaterally.  Respiratory:  Respirations regular and unlabored, clear to auscultation bilaterally. GI: Soft, nontender, nondistended, BS + x 4. MS: no deformity or atrophy. Skin: warm and dry, no rash. Neuro:  Strength and sensation are intact. Psych: Normal affect.  Accessory Clinical Findings    2D Echocardiogram 3.9.2018  Left ventricle: The cavity size was normal. Wall thickness was   normal. Systolic function was normal. The estimated  ejection   fraction was in the range of 60% to 65%. Wall motion was normal;   there were no regional wall motion abnormalities. Left   ventricular diastolic function parameters were normal. - Mitral valve: Exaggerated AV valve offset of 1.7 cm with apical   displacement of the tricuspid valve, although the leaflet   mobility is normal, suggestive of mild Ebstein anomaly variant.   Mildly thickened leaflets . There was trivial regurgitation. - Left atrium: The atrium was normal in size. - Right ventricle: Small size, normal RV systolic function. - Right atrium: The atrium was normal in size. - Tricuspid valve: There was mild regurgitation. - Pulmonary arteries: PA peak pressure: 24 mm Hg (S). - Inferior vena cava: The vessel was normal in size. The   respirophasic diameter changes were in the normal range (= 50%),   consistent with normal central venous pressure.   Impressions:   - LVEF 60-65%, normal wall thickness, normal wall motion, normal   diastolic function, exaggerated AV valve offset of 1.7 cm   suggestive of mild Ebstein anomaly variant with normal tricuspid   leaflet motion (no other congenital abnormalities identified),   normal biatrial size, smaller RV with normal RV systolic   function, mild TR, RVSP 24 mmHg, normal IVC. Ebstein anomaly is   associated with increased risk of palpitations.  Assessment & Plan    1.  Essential hypertension: Patient's blood pressure is now reasonably well controlled on lisinopril 10 mg and metoprolol XL 25 mg daily. He has been monitoring his pressure at home and it has typically been running in the 130s. He has been very active over the past 3 weeks and has had quite good exercise tolerance. He has always watched what he has eaten and is of normal weight. We went over his echocardiogram and also potential future workup for his symptoms. He still occasionally notes surges of feeling as though his pressure or heart rate are elevated. This  happened last night and both blood pressure and heart rate were reasonably normal. We discussed the possible workup for pheochromocytoma, which I suspect is unlikely, but I still offered to him. He is not currently interested in further workup for pheochromocytoma. I think this is reasonable and we could reconsider this in the future if he continues to have these symptoms or documented periodic rises in blood pressure. As his monitoring up to this point has not shown any abnormal arrhythmias, we did discuss getting off of beta blocker therapy. He is interested in this. As such, he will begin half a tablet daily for 3 days beginning tomorrow and then discontinue. In that setting, we will increase his lisinopril to 20 mg daily. He will contact us if pressures elevate further despite changes to lisinopril, at which time we could titrate this further. Of note, he notes an occasional dry cough since initiating lisinopril therapy. I encouraged him to switch to losartan, however because he feels as though he is  otherwise tolerating the lisinopril and the cough is not that bothersome, he prefers to stay on it at this time.   2. Palpitations/sinus tachycardia: No significant arrhythmias up to this point on monitoring. He will complete monitoring in 5 days. As above, in the absence of any significant arrhythmias we will plan to wean him off of beta blocker therapy.  Though echo showed normal LV function, there was suggestion of an Ebstein anomaly, which may increase risk of palpitations/atrial arrhythmias, however again, no arrhythmias have been seen up to this point despite feeling as though his HRs increase from time to time (he has documented normal HR's during times that he thinks rates are higher).  3.  Ebstein-like anomaly:  Apical displacement of TV noted on echo.  Only mild TR.  I discussed this with Dr. Rennis Golden who read the echo.  Pt has had no symptoms of DOE.  As above, though he has a h/o palpitations,  objectively, he has been found to be in sinus rhythm during subjective palpitations.  Will plan to f/u echo in a year to reassess.  4. Disposition: We discussed his case and symptoms for 45 minutes today.  His wife was present for our discussion.  Plan f/u office visit w/ echo in 1 yr.  Nicolasa Ducking, NP 02/23/2017, 8:21 AM  _____________  Echo report re-evaluated by Drs. Hilty & Nelson re: Ebstein-like anomaly.  Ultimately, it was felt that the findings did not meet the criteria for an Ebstein's anomaly and this was removed from the echo report.  The updated echo report is as listed below:  Study Conclusions  - Left ventricle: The cavity size was normal. Wall thickness was normal. Systolic function was normal. The estimated ejection fraction was in the range of 60% to 65%. Wall motion was normal; there were no regional wall motion abnormalities. Left ventricular diastolic function parameters were normal. - Mitral valve: Mildly thickened leaflets . There was trivial   regurgitation. - Left atrium: The atrium was normal in size. - Right ventricle: Small size, normal RV systolic function. - Right atrium: The atrium was normal in size. - Tricuspid valve: There was mild regurgitation. - Pulmonary arteries: PA peak pressure: 24 mm Hg (S). - Inferior vena cava: The vessel was normal in size. The   respirophasic diameter changes were in the normal range (= 50%), consistent with normal central venous pressure.  Impressions:  - LVEF 60-65%, normal wall thickness, normal wall motion, normal diastolic function, normal RV systolic function, mild TR, RVSP 24 mmHg, normal IVC.  Nicolasa Ducking, NP 03/05/2017 7:35 AM

## 2017-02-23 ENCOUNTER — Encounter: Payer: Self-pay | Admitting: Nurse Practitioner

## 2017-03-05 ENCOUNTER — Encounter: Payer: Self-pay | Admitting: Nurse Practitioner

## 2017-03-05 ENCOUNTER — Telehealth: Payer: Self-pay | Admitting: *Deleted

## 2017-03-05 NOTE — Telephone Encounter (Signed)
Results and recommendations discussed with patient, who verbalized understanding and thanks. He's aware to call if new concerns or needs are identified.

## 2017-03-05 NOTE — Telephone Encounter (Signed)
Follow up  ° ° ° °Pt is returning call to nathan. °

## 2017-03-05 NOTE — Telephone Encounter (Signed)
-----   Message from Ok Anishristopher R Berge, NP sent at 03/05/2017  7:41 AM EDT ----- Marlon PelHey Nathan,  I'm out this week and have a favor to ask.  Mr. Joan MayansOsl is a young guy with a recent h/o HTN and palpitations.  We had previously done and echo that Dr. Rennis GoldenHilty read as relatively normal but with an "Ebstein's-like anomaly."  After speaking with Dr. Rennis GoldenHilty, I had a long discussion with Mr. Joan MayansOsl re: this finding and reassured him that this was not causing his symptoms and was nothing of significant concern.  Dr. Delton SeeNelson over-read the echo on Friday and noted that the finding did not meet criteria for Ebstein anomaly and thus the report was updated and it was removed from the report.  The updated report is copied below and simply shows nl LV fxn with mild TR.    Would you please reach out to Mr. Joan MayansOsl for me to update him on the removal of the Ebstein-like anomaly from the echo and reassure him that the study was normal?  He may inquire about f/u as we previously discussed a f/u echo in 1 year.  With the removal of the Ebstein anomaly from the report, he may f/u prn.  Thanks,  [name]

## 2017-03-05 NOTE — Telephone Encounter (Signed)
Left msg for patient to call. 

## 2017-04-17 ENCOUNTER — Other Ambulatory Visit: Payer: Self-pay | Admitting: *Deleted

## 2017-04-17 MED ORDER — LISINOPRIL 20 MG PO TABS: 20.0000 mg | ORAL_TABLET | Freq: Every day | ORAL | 3 refills | Status: DC

## 2017-08-07 ENCOUNTER — Telehealth: Payer: Self-pay | Admitting: Nurse Practitioner

## 2017-08-07 DIAGNOSIS — Z79899 Other long term (current) drug therapy: Secondary | ICD-10-CM

## 2017-08-07 MED ORDER — LOSARTAN POTASSIUM 50 MG PO TABS: 50.0000 mg | ORAL_TABLET | Freq: Every day | ORAL | 3 refills | Status: DC

## 2017-08-07 NOTE — Telephone Encounter (Signed)
New message      Patient c/o Palpitations:  High priority if patient c/o lightheadedness and shortness of breath.  1. How long have you been having palpitations?  Last couple of weeks  2. Are you currently experiencing lightheadedness and shortness of breath?  no  3. Have you checked your BP and heart rate? (document readings)  141-148/75-88 and resting heart rate in the 40's.  Pt wears a watch with this info on it  4. Are you experiencing any other symptoms?  Heart racing, little dry cough Pt thinks his lisinopril may need adjusting.  Please advise

## 2017-08-07 NOTE — Telephone Encounter (Signed)
S/w pt he stated that his BP is141-148/75-88 and resting heart rate in the 40's he see the HR on his garmin watch. He states that he knows that the BP is not too concerning and HR is a little low and states that his dry cough is concerning to him, he feels ancy and anxious when it is that low and when he first started lisinopril he mentioned this to [place] and he said that we could change this if it became bothersome. He states that the cough had stopped for quite a few months and now has re-started again and is bothersome and would like to change medication. Please advise

## 2017-08-07 NOTE — Telephone Encounter (Signed)
S/w pt he states that no other sx and not taking metoprolol. Refill sent as requested.

## 2017-08-07 NOTE — Telephone Encounter (Signed)
Ok to change from lisinopril to losartan 50 mg daily (roughly equivalent to lisinopril 20).  HR is ok as long as he is asymptomatic.  Pls just confirm that he is not on  blocker, as he was briefly on metoprolol at one point earlier this year.  He should have a bmet in a week to ensure that he tolerates losartan as well as he did lisinopril.

## 2017-08-17 LAB — BASIC METABOLIC PANEL
BUN/Creatinine Ratio: 15 (ref 9–20)
BUN: 17 mg/dL (ref 6–24)
CO2: 23 mmol/L (ref 20–29)
CREATININE: 1.13 mg/dL (ref 0.76–1.27)
Calcium: 9.8 mg/dL (ref 8.7–10.2)
Chloride: 102 mmol/L (ref 96–106)
GFR, EST AFRICAN AMERICAN: 90 mL/min/{1.73_m2} (ref >59)
GFR, EST NON AFRICAN AMERICAN: 78 mL/min/{1.73_m2} (ref >59)
Glucose: 90 mg/dL (ref 65–99)
Potassium: 4.5 mmol/L (ref 3.5–5.2)
Sodium: 141 mmol/L (ref 134–144)

## 2017-08-21 ENCOUNTER — Telehealth: Payer: Self-pay | Admitting: *Deleted

## 2017-08-21 NOTE — Telephone Encounter (Signed)
Pt seen by Ward Givenshris Berge NP earlier this year. He called recently concerned about meds, we had switched his lisinopril to losartan d/t cough.  Called patient & spoke with him regarding lab results - BMET stable after med change.   He noted a concern he had regarding the new med.  States since starting losartan, he's noticed dry itching eyes - wanted to know if this might be a med SE and if so, would it resolve. Informed pt I was not sure, but was happy to have this reviewed by pharmD.  Will follow up w any advice.

## 2017-08-21 NOTE — Telephone Encounter (Signed)
Recommendations discussed with patient, who verbalized understanding and thanks.  

## 2017-08-21 NOTE — Telephone Encounter (Signed)
-----   Message from Ok Anishristopher R Berge, NP sent at 08/20/2017  9:39 AM EDT ----- Renal fxn/lytes stable.

## 2017-08-21 NOTE — Telephone Encounter (Signed)
Not seen this with losartan before.  Have him try some moisture eye drops for a couple of days.  If things don't get better in the next week we can try a different class of BP medication

## 2017-11-28 ENCOUNTER — Other Ambulatory Visit: Payer: Self-pay | Admitting: Nurse Practitioner

## 2017-11-28 NOTE — Telephone Encounter (Signed)
Rx request sent to pharmacy.  

## 2017-11-29 ENCOUNTER — Encounter: Payer: Self-pay | Admitting: Family Medicine

## 2017-11-29 ENCOUNTER — Ambulatory Visit: Payer: BLUE CROSS/BLUE SHIELD | Admitting: Family Medicine

## 2017-11-29 VITALS — BP 158/88 | HR 88 | Temp 98.1°F | Resp 16

## 2017-11-29 DIAGNOSIS — R002 Palpitations: Secondary | ICD-10-CM

## 2017-11-29 DIAGNOSIS — I10 Essential (primary) hypertension: Secondary | ICD-10-CM

## 2017-11-29 NOTE — Progress Notes (Signed)
Subjective:  Jonathan Mcguire is a 46 y.o. year old very pleasant male patient who presents for/with See problem oriented charting ROS- having palpitations. No chest pain or shortness of breath. No headache or blurry vision.    Past Medical History-  Patient Active Problem List   Diagnosis Date Noted  . Palpitations 12/01/2017  . Hypertension 01/22/2017    Medications- reviewed and updated Current Outpatient Medications  Medication Sig Dispense Refill  . losartan (COZAAR) 50 MG tablet TAKE 1 TABLET BY MOUTH EVERY DAY 30 tablet 6   Objective: BP (!) 158/88 (BP Location: Left Arm, Patient Position: Sitting, Cuff Size: Normal)   Pulse 88   Temp 98.1 F (36.7 C) (Oral)   Resp 16   SpO2 96%  Gen: NAD, resting comfortably No obvious thyromegaly CV: RRR no murmurs rubs or gallops Lungs: CTAB no crackles, wheeze, rhonchi Abdomen: soft/nontender/nondistended/normal bowel sounds.thin Ext: no edema Skin: warm, dry  EKG: sinus rhythm with rate 81 (does not appear atrial rhythm as ekg notes), normal axis, normal intervals, no hypertrophy no st or t wave changes, no q waves. Unchanged from ekg 01/30/17   Assessment/Plan:  Palpitations S: Patient started with heart palpitatoins on Friday night continuing into Saturday sand sunday. Also just "felt off" on Saturday. Intermittent through the week Worse last tnight again and felt very tired. Had similar episode earlier in the year and saw cardiology. initially was on beta blocker and that was later stopped.   Similar episodes back in January- was thought to be genetics and external factors. Mother with some slight BP issues in her 9070s on 10mg  of lisinopril.   Called into cardiology in august and was switched from lisinopril to losartan due to cough.   Works in [employer] job that is 100% comission- thought busy stretch in January may have caused it- has backed off of things- cut hours and workload. Denies shortness of breath, headache, chest pain,  lightheadedness. Has been consistently running up through last few weeks.   This past weekend started feeling palpitations again. This time does not have an elevated heart rate.  Bps have remained high in the office. Prior TSH was normal. He apparently has checked BP at home in the past and BP was not elevated.  A/P: Palpitations in 46 year old male with prior reassuring 30 day event monitor. EKG reassuring today and says he was having the symptoms at time of EKG but that he is feeling better now. Discussed could be BP related given high BP- we discussed adding a beta blocker back in potentially.  I reviewed with him prior consistent elevations in office BP Readings from Last 3 Encounters:  11/29/17 (!) 158/88  02/22/17 (!) 155/90  02/05/17 (!) 148/90  He is concerned about possible white coat hypertension so we opted for home monitoring first.see avs discussion below  Patient Instructions  I agree this could possibly be anxiety related- at least the episodes of the heart rate going up and you feeling more off than normal  Also could be related to blood pressure elevations and variations  Keep taking losartan  Record time you take losartan, record blood pressure and heart rate at least once each day and anytime you feel the heart racing/palpitations.   Follow up 2-4 weeks, bring your home cuff, your list of blood pressure readings.   If you have other new symptoms such as chest pain, shortness of breath, fever, elevated HR while resting such as over 120- see us back immediately or seek  care elsewhere if over weekend or after hours    Hypertension See palpitations note. For now will continue losartan 50mg .   Noted after visit patient called into cardiology and symptoms were keeping him up at night and losartan increased to 100mg    Future Appointments  Date Time Provider Department Center  12/14/2017 11:30 AM Azalee CourseMeng, Hao, PA [facility]  12/25/2017  2:15 PM Durene CalHunter, Aldine ContesStephen O, MD  [facility]   2-4 week follow up  Orders Placed This Encounter  Procedures  . EKG 12-Lead   The duration of face-to-face time during this visit was greater than 30 minutes. Greater than 50% of this time was spent in counseling, explanation of diagnosis, planning of further management, and/or coordination of care including discussing patient anxiety, prior workup, follow up precautions, going over treatment options, discussing home monitoring.    Return precautions advised.  Tana ConchStephen Mahasin Riviere, MD

## 2017-11-29 NOTE — Patient Instructions (Signed)
I agree this could possibly be anxiety related- at least the episodes of the heart rate going up and you feeling more off than normal  Also could be related to blood pressure elevations and variations  Keep taking losartan  Record time you take losartan, record blood pressure and heart rate at least once each day and anytime you feel the heart racing/palpitations.   Follow up 2-4 weeks, bring your home cuff, your list of blood pressure readings.   If you have other new symptoms such as chest pain, shortness of breath, fever, elevated HR while resting such as over 120- see us back immediately or seek care elsewhere if over weekend or after hours

## 2017-11-30 ENCOUNTER — Telehealth: Payer: Self-pay | Admitting: Cardiology

## 2017-11-30 ENCOUNTER — Other Ambulatory Visit: Payer: Self-pay | Admitting: Nurse Practitioner

## 2017-11-30 ENCOUNTER — Telehealth: Payer: Self-pay | Admitting: Nurse Practitioner

## 2017-11-30 ENCOUNTER — Ambulatory Visit: Payer: Self-pay | Admitting: *Deleted

## 2017-11-30 DIAGNOSIS — I10 Essential (primary) hypertension: Secondary | ICD-10-CM

## 2017-11-30 MED ORDER — LOSARTAN POTASSIUM 100 MG PO TABS: 100.0000 mg | ORAL_TABLET | Freq: Every day | ORAL | 6 refills | Status: DC

## 2017-11-30 NOTE — Telephone Encounter (Signed)
   Pt called today to report that he has been experiencing some malaise and subjective palpitations with elevated blood pressures in the 150's+.  No c/p or dyspnea.  He was seen by primary care yesterday where bp was elevated @ 158/88.  No changes made to rx.  He is on losartan 50mg  daily.  ECG during office visit showed lead reversal but otw sinus rhythm w/ nl rate.  Pt says that he did feel like maybe he was having palps during that ecg.  He has a h/o palpitations with nl 30 day event monitor earlier in the year.  He was briefly on  blocker therapy prior to return of nl event monitor.    Based on elevated bp's, I've asked him to increase losartan to 100 mg daily.  I've sent in a new Rx for this and will arrange for f/u bmet in 1 wk.  I will also request appt for patient @ [facility] office within the next few wks.  Caller verbalized understanding and was grateful for the call back.  Nicolasa Duckinghristopher Layne Dilauro, NP 11/30/2017, 3:12 PM

## 2017-11-30 NOTE — Telephone Encounter (Signed)
Patient and his wife walked into the office today for Palpitations. They said they was told to come to the office by the the triage nurse. I spoke with the patient and took his vitals. Pulse was 62, Oxygen was 100, first BP was 148/110, after about 10 minutes I retook BP and it was 144/98. Patient was not having chest pain, SOB, nausea, vomiting, numbness, or any other symptoms. He stated that he just felt uncomfortable. I spoke with Dr. Jimmey RalphParker about these symptoms and he stated that the patient was stable to leave. We do not have any providers in the office that can see the patient at this time. I advised the patient that if he felt his symptoms was getting worse to be seen immediately. While he was in the office he also called his cardiologist to see if they can see him. Patient was in good spirits and looked fine when they left the office.

## 2017-11-30 NOTE — Telephone Encounter (Signed)
New Message   Patient c/o Palpitations:  High priority if patient c/o lightheadedness, shortness of breath, or chest pain  1) How long have you had palpitations/irregular HR/ Afib? Are you having the symptoms now? For a week. Yes, currently having palpations  Are you currently experiencing lightheadedness, SOB or CP? No  2) Do you have a history of afib (atrial fibrillation) or irregular heart rhythm? no  3) Have you checked your BP or HR? (document readings if available): yes reading from varying times today bp 141/80, 161/99,  148/98   4) Are you experiencing any other symptoms? no

## 2017-11-30 NOTE — Telephone Encounter (Signed)
Called patient. He just got off the phone with Flavia Shipper. Berge, NP who addressed his concerns and is adjusting his medications. No further assistance needed at this time.

## 2017-11-30 NOTE — Telephone Encounter (Signed)
Pt stated he had seen Dr. Durene CalHunter yesterday for palpitations. He says that the palpitations are bothering him to the point that he is not resting well. He wants to know if he can be put on a beta blocker until seen again by Dr. Durene CalHunter. He is going out of town in the morning, so wondering if something could be called in for him. He denies chest pain, or shortness or breath. B/P this morning was 141/80 and 15 mins ago it was 161/99.  Heart rate has been between 80-90 range.

## 2017-11-30 NOTE — Progress Notes (Signed)
Adjusted losartan - needs bmet. Order entered.

## 2017-12-01 DIAGNOSIS — R002 Palpitations: Secondary | ICD-10-CM | POA: Insufficient documentation

## 2017-12-01 NOTE — Assessment & Plan Note (Signed)
S: Patient started with heart palpitatoins on Friday night continuing into Saturday sand sunday. Also just "felt off" on Saturday. Intermittent through the week Worse last tnight again and felt very tired. Had similar episode earlier in the year and saw cardiology. initially was on beta blocker and that was later stopped.   Similar episodes back in January- was thought to be genetics and external factors. Mother with some slight BP issues in her 6070s on 10mg  of lisinopril.   Called into cardiology in august and was switched from lisinopril to losartan due to cough.   Works in [employer] job that is 100% comission- thought busy stretch in January may have caused it- has backed off of things- cut hours and workload. Denies shortness of breath, headache, chest pain, lightheadedness. Has been consistently running up through last few weeks.   This past weekend started feeling palpitations again. This time does not have an elevated heart rate.  Bps have remained high in the office. Prior TSH was normal. He apparently has checked BP at home in the past and BP was not elevated.  A/P: Palpitations in 46 year old male with prior reassuring 30 day event monitor. EKG reassuring today and says he was having the symptoms at time of EKG but that he is feeling better now. Discussed could be BP related given high BP- we discussed adding a beta blocker back in potentially.  I reviewed with him prior consistent elevations in office BP Readings from Last 3 Encounters:  11/29/17 (!) 158/88  02/22/17 (!) 155/90  02/05/17 (!) 148/90  He is concerned about possible white coat hypertension so we opted for home monitoring first.see avs discussion below  Patient Instructions  I agree this could possibly be anxiety related- at least the episodes of the heart rate going up and you feeling more off than normal  Also could be related to blood pressure elevations and variations  Keep taking losartan  Record time you take  losartan, record blood pressure and heart rate at least once each day and anytime you feel the heart racing/palpitations.   Follow up 2-4 weeks, bring your home cuff, your list of blood pressure readings.   If you have other new symptoms such as chest pain, shortness of breath, fever, elevated HR while resting such as over 120- see us back immediately or seek care elsewhere if over weekend or after hours

## 2017-12-01 NOTE — Assessment & Plan Note (Signed)
See palpitations note. For now will continue losartan 50mg .   Noted after visit patient called into cardiology and symptoms were keeping him up at night and losartan increased to 100mg 

## 2017-12-05 ENCOUNTER — Ambulatory Visit: Payer: BLUE CROSS/BLUE SHIELD | Admitting: Cardiology

## 2017-12-05 ENCOUNTER — Encounter: Payer: Self-pay | Admitting: Cardiology

## 2017-12-05 ENCOUNTER — Telehealth: Payer: Self-pay | Admitting: Nurse Practitioner

## 2017-12-05 VITALS — BP 167/90 | HR 86 | Ht 68.0 in | Wt 142.6 lb

## 2017-12-05 DIAGNOSIS — I10 Essential (primary) hypertension: Secondary | ICD-10-CM | POA: Diagnosis not present

## 2017-12-05 MED ORDER — METOPROLOL SUCCINATE ER 25 MG PO TB24: 12.5000 mg | ORAL_TABLET | Freq: Every day | ORAL | 3 refills | Status: DC

## 2017-12-05 NOTE — Assessment & Plan Note (Signed)
Pt has noted periodic HTN that he feels is symptomatic since Feb 2018.

## 2017-12-05 NOTE — Assessment & Plan Note (Signed)
Recurrent palpitations-normal echo and negative Holter

## 2017-12-05 NOTE — Telephone Encounter (Signed)
Jonathan Mcguire is calling because he has been having palpations and did not have a good night , Blood pressure for this morning 155/97 and heart rate was at 81 at rest .   He does have an appt for tomorrow to see Franky MachoLuke . But would like to come in today if possible . Also this has been going on for about a week now . Please call

## 2017-12-05 NOTE — Progress Notes (Signed)
12/05/2017 Jonathan Mcguire   [birth year]  [id number]  Primary Physician Jonathan Mcguire, Samantha, PA Primary Cardiologist: TBA  HPI:  46 y/o married male who works is [employer], previously healthy, active, seen in ED Feb 2018 with tachycardia (ST 118) and noted to be hypertensive. He was referred to cardiology and saw Jonathan Mcguire in the office 02/22/17. He was placed on Lisinopril and a beta blocker. Echo and Holter were unremarkable, TSH was WNL. He says he initially did pretty well, then his beta blocker was weaned off secondary to vague symptoms which the pt now states was mainly noting his HR didn't go as high as it had in the past with running. The beta blocker was stopped.  He then developed a dry cough and his lisinopril was changed to Losartan. He says he did Ok till Aug when he started to have symptoms again. He attributes feeling poorly to his B/P running high- 150's systolic. He also says his heart is "pounding" but when I asked him what his HR is on his Garmin watch during these episodes he says its- 70's.  There doesn't appear to be a sudden onset or stopping of his symptoms. He says he feels "terrible" now. His HR in the office is 72- B/P by me 148/68. He denies any exertional chest pain or chest tightness.    Current Outpatient Medications  Medication Sig Dispense Refill  . losartan (COZAAR) 100 MG tablet Take 1 tablet (100 mg total) by mouth daily. 30 tablet 6   No current facility-administered medications for this visit.     No Known Allergies  Past Medical History:  Diagnosis Date  . Essential hypertension   . MIld Tricuspid regurgitation    a. 02/2017 Echo: EF 60-65%, no rwma, nl LA/RA size, nl RV fxn, mild TR.  Marland Kitchen. Palpitations   . Raynaud disease     Social History   Socioeconomic History  . Marital status: Married    Spouse name: Not on file  . Number of children: Not on file  . Years of education: Not on file  . Highest education level: Not on file  Social Needs  .  Financial resource strain: Not on file  . Food insecurity - worry: Not on file  . Food insecurity - inability: Not on file  . Transportation needs - medical: Not on file  . Transportation needs - non-medical: Not on file  Occupational History  . Not on file  Tobacco Use  . Smoking status: Never Smoker  . Smokeless tobacco: Never Used  Substance and Sexual Activity  . Alcohol use: Yes    Alcohol/week: 1.8 - 3.0 oz    Types: 2 - 3 Cans of beer, 1 - 2 Glasses of wine per week  . Drug use: No  . Sexual activity: Yes  Other Topics Concern  . Not on file  Social History Narrative   [place] and three children.  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     Family History  Problem Relation Age of Onset  . Hypertension Mother   . Cancer Father   . Cancer Maternal Grandmother   . Heart disease Maternal Grandfather   . Stroke Maternal Grandfather      Review of Systems: General: negative for chills, fever, night sweats or weight changes.  Cardiovascular: negative for chest pain, dyspnea on exertion, edema, orthopnea, palpitations, paroxysmal nocturnal dyspnea or shortness of breath Dermatological: negative for rash Respiratory:  negative for cough or wheezing Urologic: negative for hematuria Abdominal: negative for nausea, vomiting, diarrhea, bright red blood per rectum, melena, or hematemesis Neurologic: negative for visual changes, syncope, or dizziness All other systems reviewed and are otherwise negative except as noted above.    Blood pressure (!) 167/90, pulse 86, height 5\' 8"  (1.727 m), weight 142 lb 9.6 oz (64.7 kg), SpO2 100 %.  General appearance: alert, cooperative and no distress Neck: no carotid bruit and no JVD Lungs: clear to auscultation bilaterally Heart: regular rate and rhythm Extremities: extremities normal, atraumatic, no cyanosis or edema Skin: Skin color, texture, turgor normal. No rashes or  lesions Neurologic: Grossly normal  EKG NSR, QTc 432  ASSESSMENT AND PLAN:   Hypertension Pt has noted periodic HTN that he feels is symptomatic since Feb 2018.   Palpitations Recurrent palpitations-normal echo and negative Holter   PLAN  Discussed with Jonathan Givenshris Mcguire. Will r/o pheochromocytoma. I also suggested we re check his TSH. He stated he has been significantly symptomatic with "surging" feeling and tachycardia. I suggested we add Toprol 12.5 mg daily to see if that helps his symptoms. We may want to consider a loop recorder in the future if he continues to have palpitations and a sensation of tachycardia.  He'll need to see a cardiologist in follow up and I'll discuss that with [name] once the test results are back. I spent 45 minutes this afternoon in discussion with the pt and his wife.   Jonathan ShelterLuke Kassadi Presswood PA-C 12/05/2017 1:04 PM

## 2017-12-05 NOTE — Telephone Encounter (Signed)
Returned call to patient who reports continued symptoms of palpitations and high BP x 1.5 weeks.  Reports he spoke to Jonathan Mcguire a couple days ago and his medication was increased (losartan).   States the palpitations are keeping him awake and he feels "crummy".  BP 155/97 HR 81, states it is starting to make him feel anxious.  No CP or SOB.    Appt originally scheduled for tomorrow 12/27, appt moved to today 12/26 with Jonathan Mcguire at [facility].  Patient aware and verbalized understanding.

## 2017-12-05 NOTE — Patient Instructions (Signed)
Medication Instructions: START Metoprolol 12.5 mg daily (half a tablet)  If you need a refill on your cardiac medications before your next appointment, please call your pharmacy.   Labwork: Your physician recommends that you have the following lab: TSH and 24 hour urine test (Metanephrine and Catecholamine)  Follow-Up: Will be determined after the test results.  Thank you for choosing Heartcare at [facility]!!

## 2017-12-06 ENCOUNTER — Ambulatory Visit: Payer: BLUE CROSS/BLUE SHIELD | Admitting: Cardiology

## 2017-12-06 LAB — TSH: TSH: 2.9 u[IU]/mL (ref 0.450–4.500)

## 2017-12-06 LAB — T4, FREE: Free T4: 1.4 ng/dL (ref 0.82–1.77)

## 2017-12-11 HISTORY — PX: TRANSTHORACIC ECHOCARDIOGRAM: SHX275

## 2017-12-13 ENCOUNTER — Other Ambulatory Visit: Payer: Self-pay | Admitting: *Deleted

## 2017-12-13 DIAGNOSIS — I10 Essential (primary) hypertension: Secondary | ICD-10-CM

## 2017-12-13 LAB — BASIC METABOLIC PANEL
BUN/Creatinine Ratio: 12 (ref 9–20)
BUN: 14 mg/dL (ref 6–24)
CALCIUM: 9.6 mg/dL (ref 8.7–10.2)
CHLORIDE: 102 mmol/L (ref 96–106)
CO2: 25 mmol/L (ref 20–29)
CREATININE: 1.14 mg/dL (ref 0.76–1.27)
GFR calc Af Amer: 89 mL/min/{1.73_m2} (ref >59)
GFR calc non Af Amer: 77 mL/min/{1.73_m2} (ref >59)
GLUCOSE: 81 mg/dL (ref 65–99)
Potassium: 4.2 mmol/L (ref 3.5–5.2)
Sodium: 141 mmol/L (ref 134–144)

## 2017-12-13 LAB — METANEPHRINES, URINE, 24 HOUR
Metaneph Total, Ur: 153 ug/L
Metanephrines, 24H Ur: 153 ug/24 hr (ref 45–290)
Normetanephrine, 24H Ur: 240 ug/24 hr (ref 82–500)
Normetanephrine, Ur: 240 ug/L

## 2017-12-13 LAB — CATECHOLAMINES, FRACTIONATED, URINE, 24 HOUR
Dopamine , 24H Ur: 230 ug/24 hr (ref 0–510)
Dopamine, Rand Ur: 230 ug/L
Epinephrine, 24H Ur: 8 ug/24 hr (ref 0–20)
Epinephrine, Rand Ur: 8 ug/L
Norepinephrine, 24H Ur: 46 ug/24 hr (ref 0–135)
Norepinephrine, Rand Ur: 46 ug/L

## 2017-12-14 ENCOUNTER — Telehealth: Payer: Self-pay | Admitting: *Deleted

## 2017-12-14 ENCOUNTER — Ambulatory Visit: Payer: BLUE CROSS/BLUE SHIELD | Admitting: Physician Assistant

## 2017-12-14 NOTE — Telephone Encounter (Signed)
He has only seen Ward Givenshris Berge or myself, he needs to see a cardiologist.  Jonathan ShelterLUKE Kacyn Souder PA-C 12/14/2017 1:35 PM

## 2017-12-14 NOTE — Telephone Encounter (Signed)
Spoke with pt, aware of lab results. Patient is asking if the 2nd part of the urine testing is back and also he has been scheduled to see dr harding next week and is wondering why that was scheduled. Will forward to Black & Deckerluke kilroy pa to review and advise.

## 2017-12-14 NOTE — Telephone Encounter (Signed)
Spoke with pt, aware of reason for follow up appointment.

## 2017-12-17 ENCOUNTER — Ambulatory Visit: Payer: BLUE CROSS/BLUE SHIELD | Admitting: Cardiology

## 2017-12-17 ENCOUNTER — Encounter: Payer: Self-pay | Admitting: Cardiology

## 2017-12-17 VITALS — BP 152/98 | HR 76 | Ht 68.0 in | Wt 143.4 lb

## 2017-12-17 DIAGNOSIS — R931 Abnormal findings on diagnostic imaging of heart and coronary circulation: Secondary | ICD-10-CM | POA: Insufficient documentation

## 2017-12-17 DIAGNOSIS — I10 Essential (primary) hypertension: Secondary | ICD-10-CM | POA: Diagnosis not present

## 2017-12-17 DIAGNOSIS — R002 Palpitations: Secondary | ICD-10-CM

## 2017-12-17 MED ORDER — LOSARTAN POTASSIUM 50 MG PO TABS: 50.0000 mg | ORAL_TABLET | Freq: Every day | ORAL | 3 refills | Status: DC

## 2017-12-17 NOTE — Patient Instructions (Signed)
MEDICATION INSTRUCTIONS  ---- DECREASE LOSARTAN TO 50 MG DAILY  ----  Metoprolol succinate 25 mg at bedtime for the first week of taking losartan 50 mg then reduce to 12.5 mg metoprolol  at bedtime  May take the extra 12.5 mg if palpations or heart pounding occurs.    Schedule at [facility] Your physician has requested that you have an echocardiogram. Echocardiography is a painless test that uses sound waves to create images of your heart. It provides your doctor with information about the size and shape of your heart and how well your heart's chambers and valves are working. This procedure takes approximately one hour. There are no restrictions for this procedure.   Your physician recommends that you schedule a follow-up appointment in 3 months with DR HARDING.   If you need a refill on your cardiac medications before your next appointment, please call your pharmacy.

## 2017-12-17 NOTE — Progress Notes (Signed)
PCP: Jarold MottoWorley, Samantha, PA  Clinic Note: Chief Complaint  Patient presents with  . Follow-up    f/u blood work saw Jonathan Mcguire in december  . Palpitations  . Hypertension    HPI: Jonathan Mcguire is a 47 y.o. male with a PMH below who presents today for close follow-up of hypertension and palpitations after seeing Jonathan Mcguire, [place]. He has been seen by [name], NP and Jonathan ShelterLuke Kilroy, PA, but not yet seen by cardiologist.  Jonathan Criglerhristopher Mcguire was last seen on [address] Jonathan [place], [place].  This was in follow-up from a visit back in March when he was noted to have hypertension and sinus tachycardia.  He was started on lisinopril on a beta-blocker.  He had echocardiogram and Holter monitor that were unremarkable.  Apparently his beta-blocker was weaned off secondary to very vague symptoms of fatigue.  His ACE inhibitor was converted to losartan due to dry cough.  He then again started having follow-up symptoms in August where his blood pressure is began going up, but denied tachycardia.  Simply heart pounding.  When he was seen by Franky MachoLuke he felt "terrible ".  Normal heart rates with relatively normal blood pressures.  No chest pressure or tightness. Plan was to rule out pheochromocytoma and added 12.5 mg Toprol.  Consider loop recorder to evaluate palpitations.  Labs ruled out pheochromocytoma.   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 Hilty & Nelson re: Ebstein-like anomaly.  Ultimately, it was felt that the findings did not meet the criteria for an Ebstein's anomaly and this was removed from the echo report.  The updated echo report is as listed below:  Interval History: Jonathan Mcguire returns today feeling a little bit better overall, however he still feels as though he has his heart rate going up or he feels a forceful palpation sensations.  He cannot seem to slow down when this  happens.  Is not necessarily associate activity, but can it can occur more frequently with activity.  He has concerned that he may be simply just be reacting to being on medications.  We spent a very long time talking about his symptoms and his interaction of the symptoms.  It does appear that he has a significant amount of anxiety related to it.  Whenever he starts feeling these palpitations he becomes anxious in the symptoms just progress  He also had not had issues with blood pressure before, but now his blood pressures seem to be higher. He denies any PND, orthopnea or edema.  Although feel palpitations and feeling weird/20 with it, he denies any true syncope or near segment of symptoms.  No TIA or amaurosis fugax symptoms.  No chest tightness or pressure with rest or exertion.  No resting or exertional dyspnea.  No melena, hematochezia, hematuria, or epstaxis. No claudication.  ROS:  Review of Systems  Constitutional: Positive for malaise/fatigue. Negative for weight loss.  HENT: Negative for nosebleeds.   Cardiovascular: Negative for leg swelling.  Gastrointestinal: Negative for blood in stool and melena.  Genitourinary: Negative for dysuria and hematuria.  Musculoskeletal: Negative for falls and myalgias.  Neurological: Positive for dizziness (sometimes when he is having palpitations). Negative for focal weakness and loss of consciousness.  Psychiatric/Behavioral: The patient is nervous/anxious.    I have reviewed and (if needed) personally updated the patient's problem list, medications, allergies, past medical and surgical history, social and family history.  Past Medical History:  Diagnosis Date  . Essential hypertension   . MIld Tricuspid regurgitation    a. 02/2017 Echo: EF 60-65%, no rwma, nl LA/RA size, nl RV fxn, mild TR.  Marland Kitchen Palpitations   . Raynaud disease     Past Surgical History:  Procedure Laterality Date  . FRACTURE SURGERY Left 1987   Left leg, compound fx  .  TONSILLECTOMY Bilateral 1980    Current Meds  Medication Sig  . metoprolol succinate (TOPROL-XL) 25 MG 24 hr tablet Take 0.5 tablets (12.5 mg total) by mouth daily. Take with or immediately following a meal.  . [DISCONTINUED] losartan (COZAAR) 100 MG tablet Take 1 tablet (100 mg total) by mouth daily.    No Known Allergies  Social History   Tobacco Use  . Smoking status: Never Smoker  . Smokeless tobacco: Never Used  Substance Use Topics  . Alcohol use: Yes    Alcohol/week: 1.8 - 3.0 oz    Types: 2 - 3 Cans of beer, 1 - 2 Glasses of wine per week  . Drug use: No   Social History   Social History Narrative   Lives in [place] with wife and three children.  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    family history includes Cancer in his father and maternal grandmother; Cancer - Other in his paternal grandmother; Heart disease in his maternal grandfather; Hypertension in his mother; Stroke in his maternal grandfather.  Wt Readings from Last 3 Encounters:  12/17/17 143 lb 6.4 oz (65 kg)  12/05/17 142 lb 9.6 oz (64.7 kg)  02/22/17 141 lb (64 kg)    PHYSICAL EXAM BP (!) 152/98   Pulse 76   Ht 5\' 8"  (1.727 m)   Wt 143 lb 6.4 oz (65 kg)   BMI 21.80 kg/m  Physical Exam  Constitutional: He is oriented to person, place, and time. He appears well-developed and well-nourished. No distress.  Very stressed, anxious.  Talkative  HENT:  Head: Normocephalic and atraumatic.  Eyes: EOM are normal.  Neck: Normal range of motion. Neck supple. No JVD present.  Cardiovascular: Normal rate, regular rhythm and intact distal pulses. Exam reveals friction rub. Exam reveals no gallop.  No murmur heard. Pulmonary/Chest: Breath sounds normal. No respiratory distress. He has no wheezes. He has no rales.  Abdominal: Soft. Bowel sounds are normal. He exhibits no distension. There is no tenderness.  Musculoskeletal: Normal range of motion. He exhibits no edema.    Neurological: He is alert and oriented to person, place, and time.  Psychiatric: He has a normal mood and affect. Judgment and thought content normal.  anxious  Nursing note and vitals reviewed.    Adult ECG Report n/a  Other studies Reviewed: Additional studies/ records that were reviewed today include:  Recent Labs:   Lab Results  Component Value Date   CREATININE 1.14 12/13/2017   BUN 14 12/13/2017   NA 141 12/13/2017   K 4.2 12/13/2017   CL 102 12/13/2017   CO2 25 12/13/2017     ASSESSMENT / PLAN: Overall, his blood pressure seems to be high, he is very stressed today.  He says usually his blood pressure and heart rate usually well controlled.  I wonder if he may simply be overmedicated with symptomatic side effects from the meds. Plan will be to reduce losartan to 50 mg daily.  Increase Toprol to 25 mg daily.  Okay to take additional 12.5-25 mg Toprol as needed  for rapid palpitations.  He will gradually try to wean off of both medications, but I anticipate that he will likely need low-dose of both initially.  There is question of possible Ebstein's anomaly on his prior echo.  We will simply recheck 1 this year to see if there is any recurrent findings, if not, would abandon further evaluation.  Although the actual medical management and decision-making was not very significant, the patient had multiple questions and was very stressed.  I spent well over 35 minutes of the patient's room.  > 50% of the time was spent in direct patient counseling (probably >  80%)  Problem List Items Addressed This Visit    Abnormal echocardiogram   Relevant Orders   ECHOCARDIOGRAM COMPLETE   Essential hypertension (Chronic)   Relevant Medications   losartan (COZAAR) 50 MG tablet   Other Relevant Orders   ECHOCARDIOGRAM COMPLETE   Palpitations - Primary (Chronic)   Relevant Orders   ECHOCARDIOGRAM COMPLETE      Current medicines are reviewed at length with the patient today. (+/-  concerns) n/a The following changes have been made: see Below  Patient Instructions  MEDICATION INSTRUCTIONS  ---- DECREASE LOSARTAN TO 50 MG DAILY  ----  Metoprolol succinate 25 mg at bedtime for the first week of taking losartan 50 mg then reduce to 12.5 mg metoprolol  at bedtime  May take the extra 12.5 mg if palpations or heart pounding occurs.    Schedule at [address] Your physician has requested that you have an echocardiogram. Echocardiography is a painless test that uses sound waves to create images of your heart. It provides your doctor with information about the size and shape of your heart and how well your heart's chambers and valves are working. This procedure takes approximately one hour. There are no restrictions for this procedure.   Your physician recommends that you schedule a follow-up appointment in 3 months with DR Marieclaire Bettenhausen.   If you need a refill on your cardiac medications before your next appointment, please call your pharmacy.    Studies Ordered:   Orders Placed This Encounter  Procedures  . ECHOCARDIOGRAM COMPLETE      Bryan Lemma, M.D., M.S. Interventional Cardiologist   Pager # [phone] Phone # [phone] [address] [place], [place] [address]   Thank you for choosing Heartcare at [facility]!!

## 2017-12-24 ENCOUNTER — Encounter: Payer: Self-pay | Admitting: Cardiology

## 2017-12-25 ENCOUNTER — Ambulatory Visit: Payer: BLUE CROSS/BLUE SHIELD | Admitting: Family Medicine

## 2017-12-26 ENCOUNTER — Other Ambulatory Visit: Payer: Self-pay

## 2017-12-26 ENCOUNTER — Ambulatory Visit (HOSPITAL_COMMUNITY): Payer: BLUE CROSS/BLUE SHIELD | Attending: Internal Medicine

## 2017-12-26 DIAGNOSIS — I10 Essential (primary) hypertension: Secondary | ICD-10-CM

## 2017-12-26 DIAGNOSIS — R002 Palpitations: Secondary | ICD-10-CM | POA: Insufficient documentation

## 2017-12-26 DIAGNOSIS — R931 Abnormal findings on diagnostic imaging of heart and coronary circulation: Secondary | ICD-10-CM | POA: Insufficient documentation

## 2017-12-28 ENCOUNTER — Telehealth: Payer: Self-pay | Admitting: *Deleted

## 2017-12-28 NOTE — Telephone Encounter (Signed)
Spoke with patient. Echo results given . Patient had several questions 1) patient wanted to know if medications losartan  50 mg and Toprol xl 12.5 mg need to be taken on a daily basis.  he notice  Feeling "foggy in head or spacey" during the day   , feels the best  At night . Patient states he takes losartan @8  am each morning.  Patient is contributing this with losartan.  2) if must he stay on medication is there something other than 50 mg losartan 3) patient states at last visit discuss just taking toprol xl 12.5 mg prn for palp.   Next appointment is not until 03/22/2018, patient wanted to see if this could be done prior to this visit  Patient aware will defer to Dr Herbie BaltimoreHarding and contact with further instructions

## 2017-12-28 NOTE — Telephone Encounter (Signed)
-----   Message from Marykay Lexavid W Harding, MD sent at 12/26/2017 11:01 PM EST ----- Randie HeinzGreat news.  Normal echocardiogram.  Normal regional wall motion.  Normal size and pump function.  Ejection fraction is 60-65% which is the upper limit of normal. Normal valves.  Normal relaxation parameters. Normal pressures in the heart chambers.   Bryan Lemmaavid Harding, MD

## 2018-01-01 NOTE — Telephone Encounter (Signed)
Cut losartan to 25 mg & take qhs.  Bryan Lemmaavid Harding, MD

## 2018-01-02 NOTE — Telephone Encounter (Signed)
PER DR HARDING , OKAY TO TAKE TOPROL 12.5 MG AS NEEDED WILL INFORM PATIENT

## 2018-01-03 MED ORDER — LOSARTAN POTASSIUM 50 MG PO TABS: 25.0000 mg | ORAL_TABLET | Freq: Every day | ORAL | 3 refills | Status: DC

## 2018-01-03 MED ORDER — METOPROLOL SUCCINATE ER 25 MG PO TB24: 12.5000 mg | ORAL_TABLET | Freq: Every day | ORAL | 3 refills | Status: DC | PRN

## 2018-01-03 NOTE — Telephone Encounter (Signed)
Spoke to patient -- information given concerning the change in medication   verbalized understanding.  discussed on how to take  toprol xl 12.5 mg  If palpation occur.  may use for 2-3 days , if continue may increase to 25 mg for 2 -3 days then reduce to 12.5 mg for 2 to 3 days then stop until needed again. Patient verbalized understanding.  medication change on medication list.

## 2018-01-28 ENCOUNTER — Ambulatory Visit: Payer: BLUE CROSS/BLUE SHIELD | Admitting: Physician Assistant

## 2018-01-28 ENCOUNTER — Encounter: Payer: Self-pay | Admitting: Physician Assistant

## 2018-01-28 VITALS — BP 150/84 | HR 99 | Temp 98.0°F | Ht 68.0 in | Wt 142.0 lb

## 2018-01-28 DIAGNOSIS — J101 Influenza due to other identified influenza virus with other respiratory manifestations: Secondary | ICD-10-CM | POA: Diagnosis not present

## 2018-01-28 LAB — POC INFLUENZA A&B (BINAX/QUICKVUE)
INFLUENZA A, POC: POSITIVE — AB
INFLUENZA B, POC: NEGATIVE

## 2018-01-28 MED ORDER — OSELTAMIVIR PHOSPHATE 75 MG PO CAPS
75.0000 mg | ORAL_CAPSULE | Freq: Two times a day (BID) | ORAL | 0 refills | Status: DC
Start: 2018-01-28 — End: 2018-02-22

## 2018-01-28 NOTE — Progress Notes (Signed)
Jonathan Mcguire is a 47 y.o. male here for a new problem.  I acted as a Neurosurgeon for [employer], PA-C [employer], LPN.  History of Present Illness:   Chief Complaint  Patient presents with  . Cough  . Fever  . Generalized Body Aches    Cough  This is a new problem. Episode onset: Pt c/o cough, fever, body aches, headache yesterday. The problem has been gradually improving. The cough is non-productive. Associated symptoms include chills, a fever, headaches and nasal congestion. Pertinent negatives include no sore throat, shortness of breath or wheezing. Associated symptoms comments: Pt c/o fever 101 yesterday and body aches.. Risk factors: 3 children positive for FLu. Treatments tried: Took 2 doses of Tamiflu one last night and one this morning. The treatment provided mild relief.   Overall feeling slightly improved since beginning Tamiflu. Denies chest pain or shortness of breath.   Past Medical History:  Diagnosis Date  . Essential hypertension   . MIld Tricuspid regurgitation    a. 02/2017 Echo: EF 60-65%, no rwma, nl LA/RA size, nl RV fxn, mild TR.  Marland Kitchen Palpitations   . Raynaud disease      Social History   Socioeconomic History  . Marital status: Married    Spouse name: Not on file  . Number of children: Not on file  . Years of education: Not on file  . Highest education level: Not on file  Social Needs  . Financial resource strain: Not on file  . Food insecurity - worry: Not on file  . Food insecurity - inability: Not on file  . Transportation needs - medical: Not on file  . Transportation needs - non-medical: Not on file  Occupational History  . Not on file  Tobacco Use  . Smoking status: Never Smoker  . Smokeless tobacco: Never Used  Substance and Sexual Activity  . Alcohol use: Yes    Alcohol/week: 1.8 - 3.0 oz    Types: 2 - 3 Cans of beer, 1 - 2 Glasses of wine per week  . Drug use: No  . Sexual activity: Yes  Other Topics Concern  . Not on file   Social History Narrative   [place] in [place] with wife and three children.  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    Past Surgical History:  Procedure Laterality Date  . FRACTURE SURGERY Left 1987   Left leg, compound fx  . TONSILLECTOMY Bilateral 1980    Family History  Problem Relation Age of Onset  . Hypertension Mother   . Cancer Father   . Cancer Maternal Grandmother   . Heart disease Maternal Grandfather   . Stroke Maternal Grandfather   . Cancer - Other Paternal Grandmother        Kidney    No Known Allergies  Current Medications:   Current Outpatient Medications:  .  losartan (COZAAR) 50 MG tablet, Take 0.5 tablets (25 mg total) by mouth daily., Disp: 90 tablet, Rfl: 3 .  metoprolol succinate (TOPROL-XL) 25 MG 24 hr tablet, Take 0.5 tablets (12.5 mg total) by mouth daily as needed. Take with or immediately following a meal., Disp: 90 tablet, Rfl: 3 .  oseltamivir (TAMIFLU) 75 MG capsule, Take 1 capsule (75 mg total) by mouth 2 (two) times daily., Disp: 10 capsule, Rfl: 0   Review of Systems:   Review of Systems  Constitutional: Positive for chills and fever.  HENT: Negative for sore throat.   Respiratory:  Positive for cough. Negative for shortness of breath and wheezing.   Neurological: Positive for headaches.    Vitals:   Vitals:   01/28/18 1133  BP: (!) 150/84  Pulse: 99  Temp: 98 F (36.7 C)  TempSrc: Oral  Weight: 142 lb (64.4 kg)  Height: 5\' 8"  (1.727 m)     Body mass index is 21.59 kg/m.  Physical Exam:   Physical Exam  Constitutional: He appears well-developed. He is cooperative.  Non-toxic appearance. He does not have a sickly appearance. He does not appear ill. No distress.  HENT:  Head: Normocephalic and atraumatic.  Right Ear: Tympanic membrane, external ear and ear canal normal. Tympanic membrane is not erythematous, not retracted and not bulging.  Left Ear: Tympanic membrane, external ear and  ear canal normal. Tympanic membrane is not erythematous, not retracted and not bulging.  Nose: Nose normal. Right sinus exhibits no maxillary sinus tenderness and no frontal sinus tenderness. Left sinus exhibits no maxillary sinus tenderness and no frontal sinus tenderness.  Mouth/Throat: Uvula is midline and mucous membranes are normal. Posterior oropharyngeal erythema present. No posterior oropharyngeal edema. Tonsils are 0 on the right. Tonsils are 0 on the left. No tonsillar exudate.  Eyes: Conjunctivae and lids are normal.  Neck: Trachea normal.  Cardiovascular: Normal rate, regular rhythm, S1 normal, S2 normal, normal heart sounds and normal pulses.  No LE edema  Pulmonary/Chest: Effort normal and breath sounds normal. He has no decreased breath sounds. He has no wheezes. He has no rhonchi. He has no rales.  Lymphadenopathy:    He has no cervical adenopathy.  Neurological: He is alert. GCS eye subscore is 4. GCS verbal subscore is 5. GCS motor subscore is 6.  Skin: Skin is warm, dry and intact.  Psychiatric: He has a normal mood and affect. His speech is normal and behavior is normal.  Nursing note and vitals reviewed.  Results for orders placed or performed in visit on 01/28/18  POC Influenza A&B(BINAX/QUICKVUE)  Result Value Ref Range   Influenza A, POC Positive (A) Negative   Influenza B, POC Negative Negative     Assessment and Plan:    Jonathan Mcguire was seen today for cough, fever and generalized body aches.  Diagnoses and all orders for this visit:  Influenza A -     POC Influenza A&B(BINAX/QUICKVUE)  Other orders -     oseltamivir (TAMIFLU) 75 MG capsule; Take 1 capsule (75 mg total) by mouth 2 (two) times daily.   Patient with positive influenza A rapid test. Begin scheduled Tamiflu 75 mg twice a day for a total of 10 doses. Maintain good hand hygiene. Push fluids and get plenty of rest. May alternate ibuprofen and Tylenol as needed for any fever or myalgias. Call if  worsening symptoms or lack despite treatment. Patient is agreeable to plan. No red flags on exam.  . Reviewed expectations re: course of current medical issues. . Discussed self-management of symptoms. . Outlined signs and symptoms indicating need for more acute intervention. . Patient verbalized understanding and all questions were answered. . See orders for this visit as documented in the electronic medical record. . Patient received an After-Visit Summary.  CMA or LPN served as scribe during this visit. History, Physical, and Plan performed by medical provider. Documentation and orders reviewed and attested to.  Jarold MottoSamantha Yanin Muhlestein, PA-C

## 2018-01-28 NOTE — Patient Instructions (Signed)
It was great to see you!  Please take two doses of Tamiflu daily, for a total of 10 doses.  Follow-up if symptoms worsen or persist despite treatment. Push fluids!

## 2018-02-17 ENCOUNTER — Encounter: Payer: Self-pay | Admitting: Cardiology

## 2018-02-22 ENCOUNTER — Encounter: Payer: Self-pay | Admitting: Cardiology

## 2018-02-22 ENCOUNTER — Ambulatory Visit: Payer: BLUE CROSS/BLUE SHIELD | Admitting: Cardiology

## 2018-02-22 VITALS — BP 150/82 | HR 68 | Ht 68.0 in | Wt 140.4 lb

## 2018-02-22 DIAGNOSIS — I10 Essential (primary) hypertension: Secondary | ICD-10-CM | POA: Diagnosis not present

## 2018-02-22 DIAGNOSIS — R931 Abnormal findings on diagnostic imaging of heart and coronary circulation: Secondary | ICD-10-CM | POA: Diagnosis not present

## 2018-02-22 DIAGNOSIS — R002 Palpitations: Secondary | ICD-10-CM | POA: Diagnosis not present

## 2018-02-22 MED ORDER — METOPROLOL SUCCINATE ER 25 MG PO TB24: ORAL_TABLET | ORAL | 3 refills | Status: DC

## 2018-02-22 MED ORDER — METOPROLOL SUCCINATE ER 25 MG PO TB24: 12.5000 mg | ORAL_TABLET | ORAL | 3 refills | Status: DC | PRN

## 2018-02-22 NOTE — Progress Notes (Signed)
PCP: Jarold Motto, PA  Clinic Note: Chief Complaint  Patient presents with  . Palpitations    recurrent Sx; feels "overmedicated" / fatigued    HPI: Jonathan Mcguire is a 47 y.o. male with a PMH below who presents today for close follow-up of hypertension and palpitations after seeing Corine Shelter, [place]. --Essentially ruled out pheochromocytoma.  Monitor was essentially not helpful.  Jonathan Mcguire was last seen in early January.  Jonathan Mcguire was quite stressed out and his blood pressure.  We reduce his losartan and increase his Toprol to 25 mg.  Jonathan Mcguire wanted to gradually wean off medications but I did not anticipate it being a possibility.  Labs ruled out pheochromocytoma.   Recent Hospitalizations: n/a  Studies Personally Reviewed - (if available, images/films reviewed: From Epic Chart or Care Everywhere)  2D Echocardiogram: January 2019 -NORMAL Study.  EF 60-65%.  No regional wall motion normalities.  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 Hilty & Nelson re: Ebstein-like anomaly.  Ultimately, it was felt that the findings did not meet the criteria for an Ebstein's anomaly and this was removed from the echo report.  The updated echo report is as listed below:  Interval History: Jonathan Mcguire returns today quite frustrated and upset about a recurrent episode that occurred this past week.  Jonathan Mcguire has a couple episodes over the weekend where Jonathan Mcguire felt his heart beating irregular although his Fitbit indicated normal heart rates.  Jonathan Mcguire noted that his blood pressure was up into the 140-160 mmHg range systolic.  Jonathan Mcguire had some flushing associated with but no full body flushing.  No sensation of passing out just felt funny and nervous.  Jonathan Mcguire said Jonathan Mcguire felt is going on for about 3 hours at a time at least to 3 occasions over the weekend.  Jonathan Mcguire feels tired after these episodes, but in between Jonathan Mcguire has been able to run 3-4 miles a day without any issues.   Despite having the sensation of tachycardia, Jonathan Mcguire is not had any fast  heartbeats noted on his Fitbit.  Jonathan Mcguire has not any syncope or near syncope type symptoms.  No TIA or amaurosis fugax symptoms. Jonathan Mcguire feels an uncomfortable sensation in his chest during these episodes but no real exertional chest tightness or pressure to suggest angina.  No PND, orthopnea or edema.  Jonathan Mcguire is quite anxious and stressed about these episodes.  His wife is here with him today, and is much agreeing with my concern of him having anxiety related symptoms.  She indicates that his voice tone changes when having the conversation with me as opposed to what his normal baseline is.  Jonathan Mcguire is very reluctant to try to take any medications whatsoever.  Jonathan Mcguire feels like Jonathan Mcguire is fully medicated and lethargic taking low-dose losartan.  Jonathan Mcguire does not want to be on any medicines.  ROS:  Review of Systems  Constitutional: Positive for malaise/fatigue (Feels "overmedicated "). Negative for weight loss.  HENT: Negative for nosebleeds.   Respiratory: Negative for cough and sputum production.   Cardiovascular: Negative for leg swelling.  Gastrointestinal: Negative for blood in stool and melena.  Genitourinary: Negative for dysuria and hematuria.  Musculoskeletal: Negative for falls and myalgias.  Neurological: Positive for dizziness (sometimes when Jonathan Mcguire is having palpitations). Negative for focal weakness and loss of consciousness.  Psychiatric/Behavioral: The patient is nervous/anxious.    I have reviewed and (if needed) personally updated the patient's problem list, medications, allergies, past medical and surgical history, social and family history.   Past Medical History:  Diagnosis Date  . Essential hypertension   . MIld Tricuspid regurgitation    a. 02/2017 Echo: EF 60-65%, no rwma, nl LA/RA size, nl RV fxn, mild TR.  Marland Kitchen Palpitations   . Raynaud disease     Past Surgical History:  Procedure Laterality Date  . FRACTURE SURGERY Left 1987   Left leg, compound fx  . TONSILLECTOMY Bilateral 1980  . TRANSTHORACIC  ECHOCARDIOGRAM  12/2017   NORMAL Study.  EF 60-65% with no regional wall motion normalities and no valvular lesions.  (Notably no evidence of Ebstein's anomaly.    Current Meds  Medication Sig  . metoprolol succinate (TOPROL-XL) 25 MG 24 hr tablet Take 0.5 tablets (12.5 mg total) by mouth as needed.  . [DISCONTINUED] losartan (COZAAR) 50 MG tablet Take 0.5 tablets (25 mg total) by mouth daily.  . [DISCONTINUED] metoprolol succinate (TOPROL-XL) 25 MG 24 hr tablet Take 0.5 tablets (12.5 mg total) by mouth daily as needed. Take with or immediately following a meal.  . [DISCONTINUED] metoprolol succinate (TOPROL-XL) 25 MG 24 hr tablet Take 25 mg for 2 days (through the weekend), then take 12.5 mg for the rest of the week. Then resume as needed.    No Known Allergies  Social History   Tobacco Use  . Smoking status: Never Smoker  . Smokeless tobacco: Never Used  Substance Use Topics  . Alcohol use: Yes    Alcohol/week: 1.8 - 3.0 oz    Types: 2 - 3 Cans of beer, 1 - 2 Glasses of wine per week  . Drug use: No   Social History   Social History Narrative   Lives in [place] with wife and three children.  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    family history includes Cancer in his father and maternal grandmother; Cancer - Other in his paternal grandmother; Heart disease in his maternal grandfather; Hypertension in his mother; Stroke in his maternal grandfather.  Wt Readings from Last 3 Encounters:  02/22/18 140 lb 6.4 oz (63.7 kg)  01/28/18 142 lb (64.4 kg)  12/17/17 143 lb 6.4 oz (65 kg)    PHYSICAL EXAM BP (!) 150/82   Pulse 68   Ht 5\' 8"  (1.727 m)   Wt 140 lb 6.4 oz (63.7 kg)   BMI 21.35 kg/m  Physical Exam  Constitutional: Jonathan Mcguire is oriented to person, place, and time. Jonathan Mcguire appears well-developed and well-nourished. No distress.  Very stressed, anxious.  Talkative  HENT:  Head: Normocephalic and atraumatic.  Eyes: EOM are normal.  Neck:  Normal range of motion. Neck supple. No JVD present.  Cardiovascular: Normal rate, regular rhythm, normal heart sounds and intact distal pulses. Exam reveals no gallop and no friction rub.  No murmur heard. Pulmonary/Chest: Breath sounds normal. No respiratory distress. Jonathan Mcguire has no wheezes. Jonathan Mcguire has no rales.  Abdominal: Soft. Bowel sounds are normal. Jonathan Mcguire exhibits no distension. There is no tenderness. There is no rebound.  Musculoskeletal: Normal range of motion. Jonathan Mcguire exhibits no edema.  Neurological: Jonathan Mcguire is alert and oriented to person, place, and time.  Psychiatric: Jonathan Mcguire has a normal mood and affect. Judgment and thought content normal.  Very anxious and concerned with symptoms.  Not wanting to take medications, but may need to make sure there is nothing wrong with his heart.  Nursing note and vitals reviewed.    Adult ECG Report n/a  Other studies Reviewed: Additional studies/ records that were reviewed today include:  Recent Labs:  Lab Results  Component Value Date   CREATININE 1.14 12/13/2017   BUN 14 12/13/2017   NA 141 12/13/2017   K 4.2 12/13/2017   CL 102 12/13/2017   CO2 25 12/13/2017     ASSESSMENT / PLAN:  Jonathan Mcguire continues to be highly stressed.  Jonathan Mcguire is fixated on these episodes, but we have not been out of document any arrhythmias.  His blood pressures did do go up, but Jonathan Mcguire is very reluctant to take any medications.  His wife tends to agree with me that these episodes are probably related or exacerbated by anxiety. Thankfully, they are relieved at the results of the echocardiogram taking a structural abnormality out of the picture.  My recommendation at this point would be to wean off losartan, and that his blood pressures be elevated.  Jonathan Mcguire feels fatigued while on it and so they will therefore we will try slightly different. We will simply used Toprol in an as-needed basis wheNeededre Jonathan Mcguire takes a full 25 mg tablet in the setting of an episode.  Jonathan Mcguire was taken that day and the  following day and then wean down to half tablet for the next 4-5 days and then stop. \ I also encouraged that the occasionally check blood pressure levels off and on intermittently throughout the week to see what his blood pressure actually runs at home. -- > may consider HCTZ  We also discussed purchasing the smart phone application K[employer]ia by [employer] -- the APP is free, but the monitor strip costs ~$60-80.  Jonathan Mcguire seemed quite interested in this option & will look into it.  Much like his previous clinic visit, this visit was well over 45 min due to his & his wife asking multiple questions & discussing symptoms with treatment options.  > 50% of the time was spent in direct counseling.  Problem List Items Addressed This Visit    Palpitations (Chronic)   Essential hypertension - Primary (Chronic)   Relevant Medications   metoprolol succinate (TOPROL-XL) 25 MG 24 hr tablet   Other Relevant Orders   EKG 12-Lead (Completed)   Abnormal echocardiogram      Current medicines are reviewed at length with the patient today. (+/- concerns) n/a The following changes have been made: see Below  Patient Instructions  Medication Instructions: Stop the Losartan Take Metoprolol 25 mg for 2 days (through the weekend), 12.5 mg for rest of the week then resume the 12.5 mg as needed.  If you need a refill on your cardiac medications before your next appointment, please call your pharmacy.    Follow-Up: Your physician wants you to keep your follow up on 4/17 with Dr. Herbie BaltimoreHarding.   Please look into getting the [employer].  Thank you for choosing [facility] at [facility]!!      Studies Ordered:   Orders Placed This Encounter  Procedures  . EKG 12-Lead      Bryan Lemmaavid Aniela Caniglia, M.D., M.S. Interventional Cardiologist   Pager # [phone] Phone # [phone] [address] [place], [place] [address]   Thank you for choosing [facility] at [facility]!!

## 2018-02-22 NOTE — Patient Instructions (Addendum)
Medication Instructions: Stop the Losartan Take Metoprolol 25 mg for 2 days (through the weekend), 12.5 mg for rest of the week then resume the 12.5 mg as needed.  If you need a refill on your cardiac medications before your next appointment, please call your pharmacy.    Follow-Up: Your physician wants you to keep your follow up on 4/17 with Dr. Herbie BaltimoreHarding.   Please look into getting the [employer].  Thank you for choosing [facility] at [facility]!!

## 2018-02-25 ENCOUNTER — Encounter: Payer: Self-pay | Admitting: Cardiology

## 2018-03-22 ENCOUNTER — Ambulatory Visit: Payer: BLUE CROSS/BLUE SHIELD | Admitting: Cardiology

## 2018-03-27 ENCOUNTER — Ambulatory Visit: Payer: BLUE CROSS/BLUE SHIELD | Admitting: Cardiology

## 2018-03-27 ENCOUNTER — Encounter: Payer: Self-pay | Admitting: Cardiology

## 2018-03-27 VITALS — BP 151/85 | HR 68 | Ht 68.0 in | Wt 140.6 lb

## 2018-03-27 DIAGNOSIS — R002 Palpitations: Secondary | ICD-10-CM

## 2018-03-27 DIAGNOSIS — I10 Essential (primary) hypertension: Secondary | ICD-10-CM | POA: Diagnosis not present

## 2018-03-27 NOTE — Patient Instructions (Signed)
No changes with current medications     Your physician recommends that you schedule a follow-up appointment in on an as needed basis

## 2018-03-27 NOTE — Progress Notes (Signed)
PCP: Jarold Motto, PA  Clinic Note: Chief Complaint  Patient presents with  . Follow-up    3 months;  . Hypertension  . Palpitations    HPI: Jonathan Mcguire is a 47 y.o. male with a PMH below who presents today for close follow-up of hypertension and palpitations after seeing Jonathan Mcguire, [place]. --Essentially ruled out pheochromocytoma.  Monitor was essentially not helpful. Initial concerns about echocardiogram suggested possible Ebstein's anomaly.  This was not validated on follow-up report.  I initially saw him for the first time in January of this year.  He remains stressed about palpitations described his episodes of forceful heartbeats.  He is also worried about his hypertension.  I decided to increase his Toprol to 25 and reduce losartan.  Jonathan Mcguire was last seen on March 15 --remained frustrated and upset.  Still having the forceful beats, but also noted that he feels "heavy headed foggy/overmedicated on losartan.  Basically will be decided to do was having wean off losartan and then slowly wean off the metoprolol and only use it on an as-needed basis.  I stressed to him the importance of acknowledging that these palpitations are benign and that we do not necessarily need to treat unless symptoms warrant.  I feel like a lot of this has to do with high stress.   Recent Hospitalizations: n/a  Studies Personally Reviewed - (if available, images/films reviewed: From Epic Chart or Care Everywhere) --No new studies  Interval History: Robt returns today is on losartan.  At that he says it is not checking his blood pressures, and he does not notice much of a difference whether he was on or not on losartan.  He says he still feels some of the the irregular heartbeat episodes but not to the extent that he had before.  He has not decided to use the Toprol since I saw him. For the most part he is trying to stay relaxed and hydrated.  He is not noticing any syncope or near  syncopal type symptoms.  No lightheadedness or dizziness.  No dyspnea or chest pain at rest or exertion.  No heart failure symptoms of PND, orthopnea or edema.  Overall he seems to be less stressed out about these episodes and more accepting of the fact that they are benign. Feels much better not being on the losartan does not no longer feel the symptoms of foggy headedness and heavy headedness.  ROS:  Review of Systems  Constitutional: Negative for malaise/fatigue (No longer feels "medicated ") and weight loss.  HENT: Negative for nosebleeds.   Respiratory: Negative for cough and sputum production.   Cardiovascular: Negative for leg swelling.  Gastrointestinal: Negative for blood in stool and melena.  Genitourinary: Negative for dysuria and hematuria.  Musculoskeletal: Negative for falls and myalgias.  Neurological: Negative for dizziness (sometimes when he is having palpitations), focal weakness and loss of consciousness.  Psychiatric/Behavioral: The patient is nervous/anxious.    I have reviewed and (if needed) personally updated the patient's problem list, medications, allergies, past medical and surgical history, social and family history.   Past Medical History:  Diagnosis Date  . Essential hypertension   . MIld Tricuspid regurgitation    a. 02/2017 Echo: EF 60-65%, no rwma, nl LA/RA size, nl RV fxn, mild TR.  Jonathan Mcguire Palpitations   . Raynaud disease     Past Surgical History:  Procedure Laterality Date  . FRACTURE SURGERY Left 1987   Left leg, compound fx  . TONSILLECTOMY Bilateral 1980  .  TRANSTHORACIC ECHOCARDIOGRAM  12/2017   NORMAL Study.  EF 60-65% with no regional wall motion normalities and no valvular lesions.  (Notably no evidence of Ebstein's anomaly.    Current Meds  Medication Sig  . metoprolol succinate (TOPROL-XL) 25 MG 24 hr tablet Take 0.5 tablets (12.5 mg total) by mouth as needed.    No Known Allergies  Social History   Tobacco Use  . Smoking status:  Never Smoker  . Smokeless tobacco: Never Used  Substance Use Topics  . Alcohol use: Yes    Alcohol/week: 1.8 - 3.0 oz    Types: 2 - 3 Cans of beer, 1 - 2 Glasses of wine per week  . Drug use: No   Social History   Social History Narrative   Lives in [place] with wife and three children.  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    family history includes Cancer in his father and maternal grandmother; Cancer - Other in his paternal grandmother; Heart disease in his maternal grandfather; Hypertension in his mother; Stroke in his maternal grandfather.  Wt Readings from Last 3 Encounters:  03/27/18 140 lb 9.6 oz (63.8 kg)  02/22/18 140 lb 6.4 oz (63.7 kg)  01/28/18 142 lb (64.4 kg)    PHYSICAL EXAM BP (!) 151/85   Pulse 68   Ht 5\' 8"  (1.727 m)   Wt 140 lb 9.6 oz (63.8 kg)   BMI 21.38 kg/m  Physical Exam  Constitutional: He is oriented to person, place, and time. He appears well-developed and well-nourished. No distress.  Very stressed, anxious.  Talkative  HENT:  Head: Normocephalic and atraumatic.  Eyes: EOM are normal.  Cardiovascular: Normal rate, regular rhythm, normal heart sounds and intact distal pulses. Exam reveals no gallop and no friction rub.  No murmur heard. Pulmonary/Chest: Breath sounds normal. No respiratory distress. He has no wheezes. He has no rales.  Musculoskeletal: Normal range of motion. He exhibits no edema.  Neurological: He is alert and oriented to person, place, and time.  Psychiatric: He has a normal mood and affect. Judgment and thought content normal.  Very anxious and concerned with symptoms.  Not wanting to take medications, but may need to make sure there is nothing wrong with his heart.  Nursing note and vitals reviewed.    Adult ECG Report n/a  Other studies Reviewed: Additional studies/ records that were reviewed today include:  Recent Labs:   Lab Results  Component Value Date   CREATININE 1.14  12/13/2017   BUN 14 12/13/2017   NA 141 12/13/2017   K 4.2 12/13/2017   CL 102 12/13/2017   CO2 25 12/13/2017   ASSESSMENT / PLAN: Problem List Items Addressed This Visit    Palpitations (Chronic)   Essential hypertension - Primary (Chronic)     Overall Yahmir seems doing much better now.  I think basically given his age and the fact that his blood pressure is pretty well controlled (Much better than it is today usually at home).  I think a lot of his high blood pressure is situational and related to anxiety or stress including whitecoat.  For the most part I think we can safely stay off of antihypertensive agents.  He says he has plenty of the metoprolol which he can use for on the as-needed basis, but has not used it in the last couple months.  Is very happy to be off medications.  I did spend a good 10 to 15  minutes out of the 25 minutes with the patient reassuring him of the results of our studies and stab heartbeat episodes and le nature of his his blood pressure.  It is quite possible in the future he may need something for hypertension, but I think for now are safe staying off of one.  He seems to be happy with that.  If he does have more the irregular heartbeat episodes he will look into getting the [employer] smart phone APP.  For now, I think he is stable regarding stable with no real active issues.  I think we can have him return on as-needed basis.  He seemed to be happy with that concept.  Much like his previous clinic visit, this visit was well over 25 min due to his & his wife asking multiple questions & discussing symptoms with treatment options.  > 50% of the time was spent in direct counseling.  Current medicines are reviewed at length with the patient today. (+/- concerns) n/a The following changes have been made: see Below  Patient Instructions  No changes with current medications     Your physician recommends that you schedule a follow-up  appointment in on an as needed basis    Studies Ordered:   No orders of the defined types were placed in this encounter.     Bryan Lemmaavid Harding, M.D., M.S. Interventional Cardiologist   Pager # [phone] Phone # [phone] [address] [place], [place] [address]   Thank you for choosing [facility] at [facility]!!

## 2018-03-29 ENCOUNTER — Encounter: Payer: Self-pay | Admitting: Cardiology

## 2018-09-12 NOTE — Telephone Encounter (Signed)
Apparently having more palpitations.  These seem to come and go in spells.  I would simply continue on the 25 of metoprolol twice daily for the next week or 2 to see if they resolve.  Okay to take an additional 1/2-1 dose during the day if symptoms are worsening.  Also may make sure is staying adequately hydrated and avoiding triggers such as stress, caffeine, sugar, chocolate etc.  Bryan Lemma, MD

## 2018-10-01 ENCOUNTER — Ambulatory Visit: Payer: BLUE CROSS/BLUE SHIELD | Admitting: Physician Assistant

## 2018-10-01 ENCOUNTER — Encounter: Payer: Self-pay | Admitting: Physician Assistant

## 2018-10-01 ENCOUNTER — Ambulatory Visit (INDEPENDENT_AMBULATORY_CARE_PROVIDER_SITE_OTHER): Payer: BLUE CROSS/BLUE SHIELD

## 2018-10-01 VITALS — BP 170/102 | HR 67 | Temp 97.9°F | Ht 68.0 in | Wt 134.2 lb

## 2018-10-01 DIAGNOSIS — R1030 Lower abdominal pain, unspecified: Secondary | ICD-10-CM

## 2018-10-01 DIAGNOSIS — I10 Essential (primary) hypertension: Secondary | ICD-10-CM

## 2018-10-01 DIAGNOSIS — Z23 Encounter for immunization: Secondary | ICD-10-CM | POA: Diagnosis not present

## 2018-10-01 NOTE — Progress Notes (Signed)
Jonathan Mcguire is a 47 y.o. male here for a new problem.  History of Present Illness:   Chief Complaint  Patient presents with  . Abdominal Pain    x 1 1/2 wks    HPI  Endorses central lower abdominal pain x 1.5 weeks. Feels a little crampy. Not sharp and not exactly considered "pain." Constant. Bowel movements have been liquidy. Has taken probiotic which has helped stools feel more formed. Has had decreased appetite with about a 5 lb weight loss. Drinking well. No excessive ETOH intake -- approx 2-3 beers per week. No family hx of IBD, colon cancer. Does have  Hx pf pancreatic cancer in [place]. Denies nausea or hx of constipation. Not associated with food intake that he is aware of, does have sister with lactose intolerance that she acquired as adult. Eating mostly bland foods -- today had cheerios and milk, bananas and crackers. No bloody stools.  Wt Readings from Last 3 Encounters:  10/01/18 134 lb 3.2 oz (60.9 kg)  03/27/18 140 lb 9.6 oz (63.8 kg)  02/22/18 140 lb 6.4 oz (63.7 kg)   Currently taking prn toprol XL 25 mg -- has not taken in at least 1 month. At home blood pressure readings are: 130/80. Patient denies chest pain, SOB, blurred vision, dizziness, unusual headaches, lower leg swelling. Patient is compliant with medication. Denies excessive caffeine intake, stimulant usage, excessive alcohol intake, or increase in salt consumption.  BP Readings from Last 3 Encounters:  10/01/18 (!) 170/102  03/27/18 (!) 151/85  02/22/18 (!) 150/82     Past Medical History:  Diagnosis Date  . Essential hypertension   . MIld Tricuspid regurgitation    a. 02/2017 Echo: EF 60-65%, no rwma, nl LA/RA size, nl RV fxn, mild TR.  Marland Kitchen Palpitations   . Raynaud disease      Social History   Socioeconomic History  . Marital status: Married    Spouse name: Not on file  . Number of children: Not on file  . Years of education: Not on file  . Highest education level: Not on file  Occupational  History  . Not on file  Social Needs  . Financial resource strain: Not on file  . Food insecurity:    Worry: Not on file    Inability: Not on file  . Transportation needs:    Medical: Not on file    Non-medical: Not on file  Tobacco Use  . Smoking status: Never Smoker  . Smokeless tobacco: Never Used  Substance and Sexual Activity  . Alcohol use: Yes    Alcohol/week: 3.0 - 5.0 standard drinks    Types: 2 - 3 Cans of beer, 1 - 2 Glasses of wine per week  . Drug use: No  . Sexual activity: Yes  Lifestyle  . Physical activity:    Days per week: Not on file    Minutes per session: Not on file  . Stress: Not on file  Relationships  . Social connections:    Talks on phone: Not on file    Gets together: Not on file    Attends religious service: Not on file    Active member of club or organization: Not on file    Attends meetings of clubs or organizations: Not on file    Relationship status: Not on file  . Intimate partner violence:    Fear of current or ex partner: Not on file    Emotionally abused: Not on file    Physically  abused: Not on file    Forced sexual activity: Not on file  Other Topics Concern  . Not on file  Social History Narrative   Lives in [place] with wife and three children.  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    Past Surgical History:  Procedure Laterality Date  . FRACTURE SURGERY Left 1987   Left leg, compound fx  . TONSILLECTOMY Bilateral 1980  . TRANSTHORACIC ECHOCARDIOGRAM  12/2017   NORMAL Study.  EF 60-65% with no regional wall motion normalities and no valvular lesions.  (Notably no evidence of Ebstein's anomaly.    Family History  Problem Relation Age of Onset  . Hypertension Mother   . Cancer Father        ?neck  . Cancer Maternal Grandmother        pancreatic  . Heart disease Maternal Grandfather   . Stroke Maternal Grandfather        Tobacco abuse  . Cancer - Other Paternal Grandmother         Kidney  . Colon cancer Neg Hx     No Known Allergies  Current Medications:   Current Outpatient Medications:  .  metoprolol succinate (TOPROL-XL) 25 MG 24 hr tablet, Take 0.5 tablets (12.5 mg total) by mouth as needed., Disp: 90 tablet, Rfl: 3   Review of Systems:   ROS  Negative unless otherwise specified per HPI.  Vitals:   Vitals:   10/01/18 1523 10/01/18 1610  BP: (!) 182/98 (!) 170/102  Pulse: 67   Temp: 97.9 F (36.6 C)   TempSrc: Oral   SpO2: 99%   Weight: 134 lb 3.2 oz (60.9 kg)   Height: 5\' 8"  (1.727 m)      Body mass index is 20.41 kg/m.  Physical Exam:   Physical Exam  Constitutional: He appears well-developed. He is cooperative.  Non-toxic appearance. He does not have a sickly appearance. He does not appear ill. No distress.  Cardiovascular: Normal rate, regular rhythm, S1 normal, S2 normal, normal heart sounds and normal pulses.  No LE edema  Pulmonary/Chest: Effort normal and breath sounds normal.  Abdominal: Normal appearance and bowel sounds are normal. There is no tenderness. There is no rigidity, no rebound, no guarding, no CVA tenderness, no tenderness at McBurney's point and negative Murphy's sign.  Neurological: He is alert. GCS eye subscore is 4. GCS verbal subscore is 5. GCS motor subscore is 6.  Skin: Skin is warm, dry and intact.  Psychiatric: He has a normal mood and affect. His speech is normal and behavior is normal.  Nursing note and vitals reviewed.  Abd xray with mild stool burden, official read pending  Assessment and Plan:    Emir was seen today for abdominal pain.  Diagnoses and all orders for this visit:  Lower abdominal pain Abdominal xray with some stool burden, discussed starting Miralax regimen to see if this will help with regularity. Labs and urine pending. I did discuss the importance of small, frequent meals and importance of monitoring his weight. If after symptoms resolve if weight continues to decline, needs  follow-up with [place]. Further intervention based on labs and patient response to treatment. -     DG Abd 2 Views; Future -     TSH -     CBC with Differential/Platelet -     Comprehensive metabolic panel -     Lipase -     Urinalysis, Routine w reflex microscopic  Need  for immunization against influenza -     Flu Vaccine QUAD 36+ mos IM  Essential hypertension Asymptomatic. He has a strong history of white coat HTN. He will continue to monitor BP at home. Stroke precautions advised.  . Reviewed expectations re: course of current medical issues. . Discussed self-management of symptoms. . Outlined signs and symptoms indicating need for more acute intervention. . Patient verbalized understanding and all questions were answered. . See orders for this visit as documented in the electronic medical record. . Patient received an After-Visit Summary.  Jarold Motto, PA-C

## 2018-10-01 NOTE — Patient Instructions (Signed)
It was great to see you!  Start capful of Miralax daily x 3 days. Increase by 1/2 capful if unsuccessful results. Follow-up if no improvement in symptoms.  Continue probiotic.  Keep an eye on your weight, if it continues to drop, I want to see you.  Take care,  Jarold Motto PA-C

## 2018-10-02 ENCOUNTER — Encounter: Payer: Self-pay | Admitting: Physician Assistant

## 2018-10-02 LAB — URINALYSIS, ROUTINE W REFLEX MICROSCOPIC
BILIRUBIN URINE: NEGATIVE
Hgb urine dipstick: NEGATIVE
Ketones, ur: NEGATIVE
LEUKOCYTES UA: NEGATIVE
Nitrite: NEGATIVE
PH: 7 (ref 5.0–8.0)
RBC / HPF: NONE SEEN (ref 0–?)
Specific Gravity, Urine: 1.015 (ref 1.000–1.030)
Total Protein, Urine: NEGATIVE
URINE GLUCOSE: NEGATIVE
Urobilinogen, UA: 1 (ref 0.0–1.0)
WBC UA: NONE SEEN (ref 0–?)

## 2018-10-02 LAB — CBC WITH DIFFERENTIAL/PLATELET
Basophils Absolute: 0.1 10*3/uL (ref 0.0–0.1)
Basophils Relative: 1.2 % (ref 0.0–3.0)
EOS PCT: 1.8 % (ref 0.0–5.0)
Eosinophils Absolute: 0.1 10*3/uL (ref 0.0–0.7)
HCT: 48.4 % (ref 39.0–52.0)
Hemoglobin: 16.6 g/dL (ref 13.0–17.0)
LYMPHS ABS: 1.8 10*3/uL (ref 0.7–4.0)
Lymphocytes Relative: 25.5 % (ref 12.0–46.0)
MCHC: 34.4 g/dL (ref 30.0–36.0)
MCV: 86.9 fl (ref 78.0–100.0)
Monocytes Absolute: 0.5 10*3/uL (ref 0.1–1.0)
Monocytes Relative: 7.3 % (ref 3.0–12.0)
NEUTROS ABS: 4.4 10*3/uL (ref 1.4–7.7)
NEUTROS PCT: 64.2 % (ref 43.0–77.0)
PLATELETS: 215 10*3/uL (ref 150.0–400.0)
RBC: 5.57 Mil/uL (ref 4.22–5.81)
RDW: 12.8 % (ref 11.5–15.5)
WBC: 6.9 10*3/uL (ref 4.0–10.5)

## 2018-10-02 LAB — COMPREHENSIVE METABOLIC PANEL
ALK PHOS: 43 U/L (ref 39–117)
ALT: 16 U/L (ref 0–53)
AST: 19 U/L (ref 0–37)
Albumin: 5.2 g/dL (ref 3.5–5.2)
BUN: 21 mg/dL (ref 6–23)
CALCIUM: 9.7 mg/dL (ref 8.4–10.5)
CO2: 27 meq/L (ref 19–32)
Chloride: 99 mEq/L (ref 96–112)
Creatinine, Ser: 1.12 mg/dL (ref 0.40–1.50)
GFR: 74.79 mL/min (ref >60.00)
GLUCOSE: 108 mg/dL — AB (ref 70–99)
POTASSIUM: 3.9 meq/L (ref 3.5–5.1)
Sodium: 137 mEq/L (ref 135–145)
Total Bilirubin: 1.1 mg/dL (ref 0.2–1.2)
Total Protein: 8 g/dL (ref 6.0–8.3)

## 2018-10-02 LAB — LIPASE: Lipase: 25 U/L (ref 11.0–59.0)

## 2018-10-02 LAB — TSH: TSH: 2.76 u[IU]/mL (ref 0.35–4.50)

## 2018-10-10 ENCOUNTER — Encounter: Payer: Self-pay | Admitting: Physician Assistant

## 2018-10-22 ENCOUNTER — Encounter: Payer: Self-pay | Admitting: Physician Assistant

## 2018-10-23 ENCOUNTER — Ambulatory Visit: Payer: BLUE CROSS/BLUE SHIELD | Admitting: Physician Assistant

## 2018-10-23 ENCOUNTER — Encounter: Payer: Self-pay | Admitting: Physician Assistant

## 2018-10-23 VITALS — BP 149/84 | HR 74 | Temp 97.7°F | Ht 68.0 in | Wt 137.0 lb

## 2018-10-23 DIAGNOSIS — F419 Anxiety disorder, unspecified: Secondary | ICD-10-CM

## 2018-10-23 DIAGNOSIS — R1030 Lower abdominal pain, unspecified: Secondary | ICD-10-CM | POA: Diagnosis not present

## 2018-10-23 MED ORDER — SERTRALINE HCL 25 MG PO TABS: 25.0000 mg | ORAL_TABLET | Freq: Every day | ORAL | 1 refills | Status: DC

## 2018-10-23 NOTE — Progress Notes (Signed)
Jonathan Mcguire is a 47 y.o. male is here to discuss: GI issues  I acted as a Neurosurgeon for [employer], PA-C Corky Mull, LPN  History of Present Illness:   Chief Complaint  Patient presents with  . Abdominal Pain    Abdominal Pain  This is a recurrent problem. Episode onset: Started a month ago. The problem occurs constantly. The problem has been gradually improving. The pain is located in the periumbilical region. The pain is at a severity of 3/10. The pain is mild. The quality of the pain is aching. The abdominal pain does not radiate. Pertinent negatives include no constipation, diarrhea, fever, frequency, headaches, nausea or vomiting. Associated symptoms comments: Bloating. Nothing aggravates the pain. The pain is relieved by nothing. Treatments tried: Miralax. Improvement on treatment: Since Miralax bowels movements are back to normal.   Constipation is back to normal. He stopped the Miralax. Has not changed his diet since he last saw me. We had briefly talked about trialing decreasing or eliminating dairy but he didn't feel like he needed to do that. Did go for a brisk walk last night that he hasn't been able to in the past while since having these symptoms and states that this morning is the first time in about 3-4 weeks that he doesn't have any pain/discomfort. He is starting to wonder if he has some underlying anxiety. Has never had any issues with anxiety in the past but is now wondering if it is contributing. He had some episodes of palpitations in the past that was worked up by cardiology. He has been talking to his friends and they all note that he is "tightly wound." He has been all of his life and feels as though maybe now its really starting to affect him.  Wt Readings from Last 5 Encounters:  10/23/18 137 lb (62.1 kg)  10/01/18 134 lb 3.2 oz (60.9 kg)  03/27/18 140 lb 9.6 oz (63.8 kg)  02/22/18 140 lb 6.4 oz (63.7 kg)  01/28/18 142 lb (64.4 kg)   GAD 7 :  Generalized Anxiety Score 10/23/2018  Nervous, Anxious, on Edge 2  Control/stop worrying 1  Worry too much - different things 1  Trouble relaxing 2  Restless 2  Easily annoyed or irritable 0  Afraid - awful might happen 0  Total GAD 7 Score 8  Anxiety Difficulty Not difficult at all   Denies: bloody stools, fever, pain only at night, diarrhea, weight loss   There are no preventive care reminders to display for this patient.  Past Medical History:  Diagnosis Date  . Essential hypertension   . MIld Tricuspid regurgitation    a. 02/2017 Echo: EF 60-65%, no rwma, nl LA/RA size, nl RV fxn, mild TR.  Marland Kitchen Palpitations   . Raynaud disease      Social History   Socioeconomic History  . Marital status: Married    Spouse name: Not on file  . Number of children: Not on file  . Years of education: Not on file  . Highest education level: Not on file  Occupational History  . Not on file  Social Needs  . Financial resource strain: Not on file  . Food insecurity:    Worry: Not on file    Inability: Not on file  . Transportation needs:    Medical: Not on file    Non-medical: Not on file  Tobacco Use  . Smoking status: Never Smoker  . Smokeless tobacco: Never Used  Substance and Sexual  Activity  . Alcohol use: Yes    Alcohol/week: 3.0 - 5.0 standard drinks    Types: 2 - 3 Cans of beer, 1 - 2 Glasses of wine per week  . Drug use: No  . Sexual activity: Yes  Lifestyle  . Physical activity:    Days per week: Not on file    Minutes per session: Not on file  . Stress: Not on file  Relationships  . Social connections:    Talks on phone: Not on file    Gets together: Not on file    Attends religious service: Not on file    Active member of club or organization: Not on file    Attends meetings of clubs or organizations: Not on file    Relationship status: Not on file  . Intimate partner violence:    Fear of current or ex partner: Not on file    Emotionally abused: Not on file     Physically abused: Not on file    Forced sexual activity: Not on file  Other Topics Concern  . Not on file  Social History Narrative   [place] in [place] with wife and three children.  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    Past Surgical History:  Procedure Laterality Date  . FRACTURE SURGERY Left 1987   Left leg, compound fx  . TONSILLECTOMY Bilateral 1980  . TRANSTHORACIC ECHOCARDIOGRAM  12/2017   NORMAL Study.  EF 60-65% with no regional wall motion normalities and no valvular lesions.  (Notably no evidence of Ebstein's anomaly.    Family History  Problem Relation Age of Onset  . Hypertension Mother   . Cancer Father        ?neck  . Cancer Maternal Grandmother        pancreatic  . Heart disease Maternal Grandfather   . Stroke Maternal Grandfather        Tobacco abuse  . Cancer - Other Paternal Grandmother        Kidney  . Colon cancer Neg Hx     PMHx, SurgHx, SocialHx, FamHx, Medications, and Allergies were reviewed in the Visit Navigator and updated as appropriate.   Patient Active Problem List   Diagnosis Date Noted  . Abnormal echocardiogram 12/17/2017  . Palpitations 12/01/2017  . Essential hypertension 01/22/2017    Social History   Tobacco Use  . Smoking status: Never Smoker  . Smokeless tobacco: Never Used  Substance Use Topics  . Alcohol use: Yes    Alcohol/week: 3.0 - 5.0 standard drinks    Types: 2 - 3 Cans of beer, 1 - 2 Glasses of wine per week  . Drug use: No    Current Medications and Allergies:    Current Outpatient Medications:  .  metoprolol succinate (TOPROL-XL) 25 MG 24 hr tablet, Take 0.5 tablets (12.5 mg total) by mouth as needed., Disp: 90 tablet, Rfl: 3 .  sertraline (ZOLOFT) 25 MG tablet, Take 1 tablet (25 mg total) by mouth daily., Disp: 30 tablet, Rfl: 1  No Known Allergies  Review of Systems   Review of Systems  Constitutional: Negative for fever.  Gastrointestinal: Positive for  abdominal pain. Negative for constipation, diarrhea, nausea and vomiting.  Genitourinary: Negative for frequency.  Neurological: Negative for headaches.    Vitals:   Vitals:   10/23/18 0838 10/23/18 0945  BP: (!) 156/82 (!) 149/84  Pulse: 74   Temp: 97.7 F (36.5 C)   TempSrc: Oral  SpO2: 99%   Weight: 137 lb (62.1 kg)   Height: 5\' 8"  (1.727 m)      Body mass index is 20.83 kg/m.   Physical Exam:    Physical Exam  Constitutional: He appears well-developed. He is cooperative.  Non-toxic appearance. He does not have a sickly appearance. He does not appear ill. No distress.  Cardiovascular: Normal rate, regular rhythm, S1 normal, S2 normal, normal heart sounds and normal pulses.  No LE edema  Pulmonary/Chest: Effort normal and breath sounds normal.  Abdominal: Normal appearance and bowel sounds are normal. There is no tenderness. There is no rigidity, no rebound, no guarding and no CVA tenderness.  Neurological: He is alert. GCS eye subscore is 4. GCS verbal subscore is 5. GCS motor subscore is 6.  Skin: Skin is warm, dry and intact.  Psychiatric: He has a normal mood and affect. His speech is normal and behavior is normal.  Nursing note and vitals reviewed.    Assessment and Plan:    Jonathan Mcguire was seen today for abdominal pain.  Diagnoses and all orders for this visit:  Lower abdominal pain Resolved as of this morning. No red flag symptoms. He will continue to monitor symptoms and let us know if symptoms return. If they do, we will proceed with abd u/s.  Anxiety Very long conversation about this today. He is agreeable to possibly starting Zoloft 25 mg daily. Follow-up inn 4-6 weeks, sooner if issues or concerns.  Other orders -     sertraline (ZOLOFT) 25 MG tablet; Take 1 tablet (25 mg total) by mouth daily.  . Reviewed expectations re: course of current medical issues. . Discussed self-management of symptoms. . Outlined signs and symptoms indicating need for  more acute intervention. . Patient verbalized understanding and all questions were answered. . See orders for this visit as documented in the electronic medical record. . Patient received an After Visit Summary.  CMA or LPN served as scribe during this visit. History, Physical, and Plan performed by medical provider. The above documentation has been reviewed and is accurate and complete.  Jarold Motto, PA-C [place], [place] 10/23/2018  Follow-up: No follow-ups on file.

## 2018-10-23 NOTE — Patient Instructions (Signed)
It was great to see you!  If you start the medication, please follow-up with me in 4-6 weeks after starting.  If your abdominal pain returns (and is the same as we have discussed and not changed in any way) please let me know and we can order abdominal ultrasound.  Take care,  Jarold MottoSamantha Anniemae Haberkorn PA-C

## 2018-10-29 ENCOUNTER — Encounter: Payer: Self-pay | Admitting: Physician Assistant

## 2018-11-04 ENCOUNTER — Other Ambulatory Visit: Payer: Self-pay | Admitting: Physician Assistant

## 2018-11-04 MED ORDER — BUSPIRONE HCL 5 MG PO TABS: 5.0000 mg | ORAL_TABLET | Freq: Three times a day (TID) | ORAL | 0 refills | Status: DC | PRN

## 2018-11-12 ENCOUNTER — Encounter: Payer: Self-pay | Admitting: Physician Assistant

## 2018-11-12 ENCOUNTER — Ambulatory Visit: Payer: BLUE CROSS/BLUE SHIELD | Admitting: Physician Assistant

## 2018-11-12 VITALS — BP 160/84 | HR 93 | Temp 98.0°F | Ht 68.0 in | Wt 136.5 lb

## 2018-11-12 DIAGNOSIS — F419 Anxiety disorder, unspecified: Secondary | ICD-10-CM | POA: Diagnosis not present

## 2018-11-12 DIAGNOSIS — R05 Cough: Secondary | ICD-10-CM

## 2018-11-12 DIAGNOSIS — R059 Cough, unspecified: Secondary | ICD-10-CM

## 2018-11-12 MED ORDER — GUAIFENESIN-CODEINE 100-10 MG/5ML PO SYRP: 10.0000 mL | ORAL_SOLUTION | Freq: Every evening | ORAL | 0 refills | Status: DC | PRN

## 2018-11-12 MED ORDER — DOXYCYCLINE HYCLATE 100 MG PO TABS: 100.0000 mg | ORAL_TABLET | Freq: Two times a day (BID) | ORAL | 0 refills | Status: DC

## 2018-11-12 NOTE — Patient Instructions (Signed)
It was great to see you!   You have a viral upper respiratory infection. Antibiotics are not needed for this.  Viral infections usually take 7-10 days to resolve.  The cough can last a few weeks to go away.   Use medication as prescribed: Cheratussin  Start daily antihistamine  If you need antibiotic, start it (Doxycycline)  Push fluids and get plenty of rest. Please return if you are not improving as expected, or if you have high fevers (>101.5) or difficulty swallowing or worsening productive cough.  Call clinic with questions.  I hope you start feeling better soon!

## 2018-11-12 NOTE — Progress Notes (Signed)
Jonathan Mcguire is a 47 y.o. male here for a new problem.  I acted as a Neurosurgeon for [employer], PA-C Corky Mull, LPN  History of Present Illness:   Chief Complaint  Patient presents with  . Cough    Cough  This is a new problem. Episode onset: Started on Friday. The problem has been gradually worsening. The problem occurs every few hours. The cough is productive of sputum (expectorating brown/yellow sputum). Associated symptoms include chills, nasal congestion, postnasal drip, a sore throat and shortness of breath. Pertinent negatives include no ear congestion, ear pain, fever, headaches or wheezing. Associated symptoms comments: Chest congestion. The symptoms are aggravated by lying down. He has tried OTC cough suppressant for the symptoms. The treatment provided no relief. There is no history of asthma, bronchitis or pneumonia.   Anxiety He is unable to tolerate Zoloft and BuSpar, as a cause dizziness and lightheadedness.  He is taking metoprolol as needed for panic attacks.  He has not had to take this in quite a while.  Overall he feels improved.  He is not interested in starting other medication at this time.  Past Medical History:  Diagnosis Date  . Essential hypertension   . MIld Tricuspid regurgitation    a. 02/2017 Echo: EF 60-65%, no rwma, nl LA/RA size, nl RV fxn, mild TR.  Marland Kitchen Palpitations   . Raynaud disease      Social History   Socioeconomic History  . Marital status: Married    Spouse name: Not on file  . Number of children: Not on file  . Years of education: Not on file  . Highest education level: Not on file  Occupational History  . Not on file  Social Needs  . Financial resource strain: Not on file  . Food insecurity:    Worry: Not on file    Inability: Not on file  . Transportation needs:    Medical: Not on file    Non-medical: Not on file  Tobacco Use  . Smoking status: Never Smoker  . Smokeless tobacco: Never Used  Substance and Sexual  Activity  . Alcohol use: Yes    Alcohol/week: 3.0 - 5.0 standard drinks    Types: 2 - 3 Cans of beer, 1 - 2 Glasses of wine per week  . Drug use: No  . Sexual activity: Yes  Lifestyle  . Physical activity:    Days per week: Not on file    Minutes per session: Not on file  . Stress: Not on file  Relationships  . Social connections:    Talks on phone: Not on file    Gets together: Not on file    Attends religious service: Not on file    Active member of club or organization: Not on file    Attends meetings of clubs or organizations: Not on file    Relationship status: Not on file  . Intimate partner violence:    Fear of current or ex partner: Not on file    Emotionally abused: Not on file    Physically abused: Not on file    Forced sexual activity: Not on file  Other Topics Concern  . Not on file  Social History Narrative   Lives in [place] with wife and three children.  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    Past Surgical History:  Procedure Laterality Date  . FRACTURE SURGERY Left 1987   Left leg, compound fx  .  TONSILLECTOMY Bilateral 1980  . TRANSTHORACIC ECHOCARDIOGRAM  12/2017   NORMAL Study.  EF 60-65% with no regional wall motion normalities and no valvular lesions.  (Notably no evidence of Ebstein's anomaly.    Family History  Problem Relation Age of Onset  . Hypertension Mother   . Cancer Father        ?neck  . Cancer Maternal Grandmother        pancreatic  . Heart disease Maternal Grandfather   . Stroke Maternal Grandfather        Tobacco abuse  . Cancer - Other Paternal Grandmother        Kidney  . Colon cancer Neg Hx     No Known Allergies  Current Medications:   Current Outpatient Medications:  .  doxycycline (VIBRA-TABS) 100 MG tablet, Take 1 tablet (100 mg total) by mouth 2 (two) times daily., Disp: 20 tablet, Rfl: 0 .  guaiFENesin-codeine (CHERATUSSIN AC) 100-10 MG/5ML syrup, Take 10 mLs by mouth at  bedtime as needed for cough or congestion., Disp: 120 mL, Rfl: 0 .  metoprolol succinate (TOPROL-XL) 25 MG 24 hr tablet, Take 0.5 tablets (12.5 mg total) by mouth as needed., Disp: 90 tablet, Rfl: 3   Review of Systems:   Review of Systems  Constitutional: Positive for chills. Negative for fever.  HENT: Positive for postnasal drip and sore throat. Negative for ear pain.   Respiratory: Positive for cough and shortness of breath. Negative for wheezing.   Neurological: Negative for headaches.    Vitals:   Vitals:   11/12/18 1331  BP: (!) 160/84  Pulse: 93  Temp: 98 F (36.7 C)  TempSrc: Oral  SpO2: 98%  Weight: 136 lb 8 oz (61.9 kg)  Height: 5\' 8"  (1.727 m)     Body mass index is 20.75 kg/m.  Physical Exam:   Physical Exam  Constitutional: He appears well-developed. He is cooperative.  Non-toxic appearance. He does not have a sickly appearance. He does not appear ill. No distress.  HENT:  Head: Normocephalic and atraumatic.  Right Ear: Tympanic membrane, external ear and ear canal normal. Tympanic membrane is not erythematous, not retracted and not bulging.  Left Ear: Tympanic membrane, external ear and ear canal normal. Tympanic membrane is not erythematous, not retracted and not bulging.  Nose: Nose normal. Right sinus exhibits no maxillary sinus tenderness and no frontal sinus tenderness. Left sinus exhibits no maxillary sinus tenderness and no frontal sinus tenderness.  Mouth/Throat: Uvula is midline. No posterior oropharyngeal edema or posterior oropharyngeal erythema.  Eyes: Conjunctivae and lids are normal.  Neck: Trachea normal.  Cardiovascular: Normal rate, regular rhythm, S1 normal, S2 normal and normal heart sounds.  Pulmonary/Chest: Effort normal and breath sounds normal. He has no decreased breath sounds. He has no wheezes. He has no rhonchi. He has no rales.  Lymphadenopathy:    He has no cervical adenopathy.  Neurological: He is alert.  Skin: Skin is warm, dry  and intact.  Psychiatric: He has a normal mood and affect. His speech is normal and behavior is normal.  Nursing note and vitals reviewed.    Assessment and Plan:   Jonathan Mcguire was seen today for cough.  Diagnoses and all orders for this visit:  Anxiety Well controlled currently. Continue to monitor at follow-up.  Cough No red flags on exam.  Will initiate Cheratussin AC per orders.  I also recommended an antihistamine daily to help with postnasal drip.  Safety net prescription of Doxycycline given. Discussed taking medications  as prescribed. Reviewed return precautions including worsening fever, SOB, worsening cough or other concerns. Push fluids and rest. I recommend that patient follow-up if symptoms worsen or persist despite treatment x 7-10 days, sooner if needed.  Other orders -     Discontinue: guaiFENesin-codeine (CHERATUSSIN AC) 100-10 MG/5ML syrup; Take 10 mLs by mouth at bedtime as needed for cough or congestion. -     guaiFENesin-codeine (CHERATUSSIN AC) 100-10 MG/5ML syrup; Take 10 mLs by mouth at bedtime as needed for cough or congestion. -     doxycycline (VIBRA-TABS) 100 MG tablet; Take 1 tablet (100 mg total) by mouth 2 (two) times daily.    . Reviewed expectations re: course of current medical issues. . Discussed self-management of symptoms. . Outlined signs and symptoms indicating need for more acute intervention. . Patient verbalized understanding and all questions were answered. . See orders for this visit as documented in the electronic medical record. . Patient received an After-Visit Summary.  CMA or LPN served as scribe during this visit. History, Physical, and Plan performed by medical provider. The above documentation has been reviewed and is accurate and complete.   Jarold MottoSamantha Ayame Rena, PA-C

## 2018-11-25 IMAGING — CR DG CHEST 2V
2 series · 2 of 2 positions shown · non-contrast
Comparison: No recent prior .

CLINICAL DATA: Tachycardia.

EXAM:
CHEST  2 VIEW

[w chest pa]
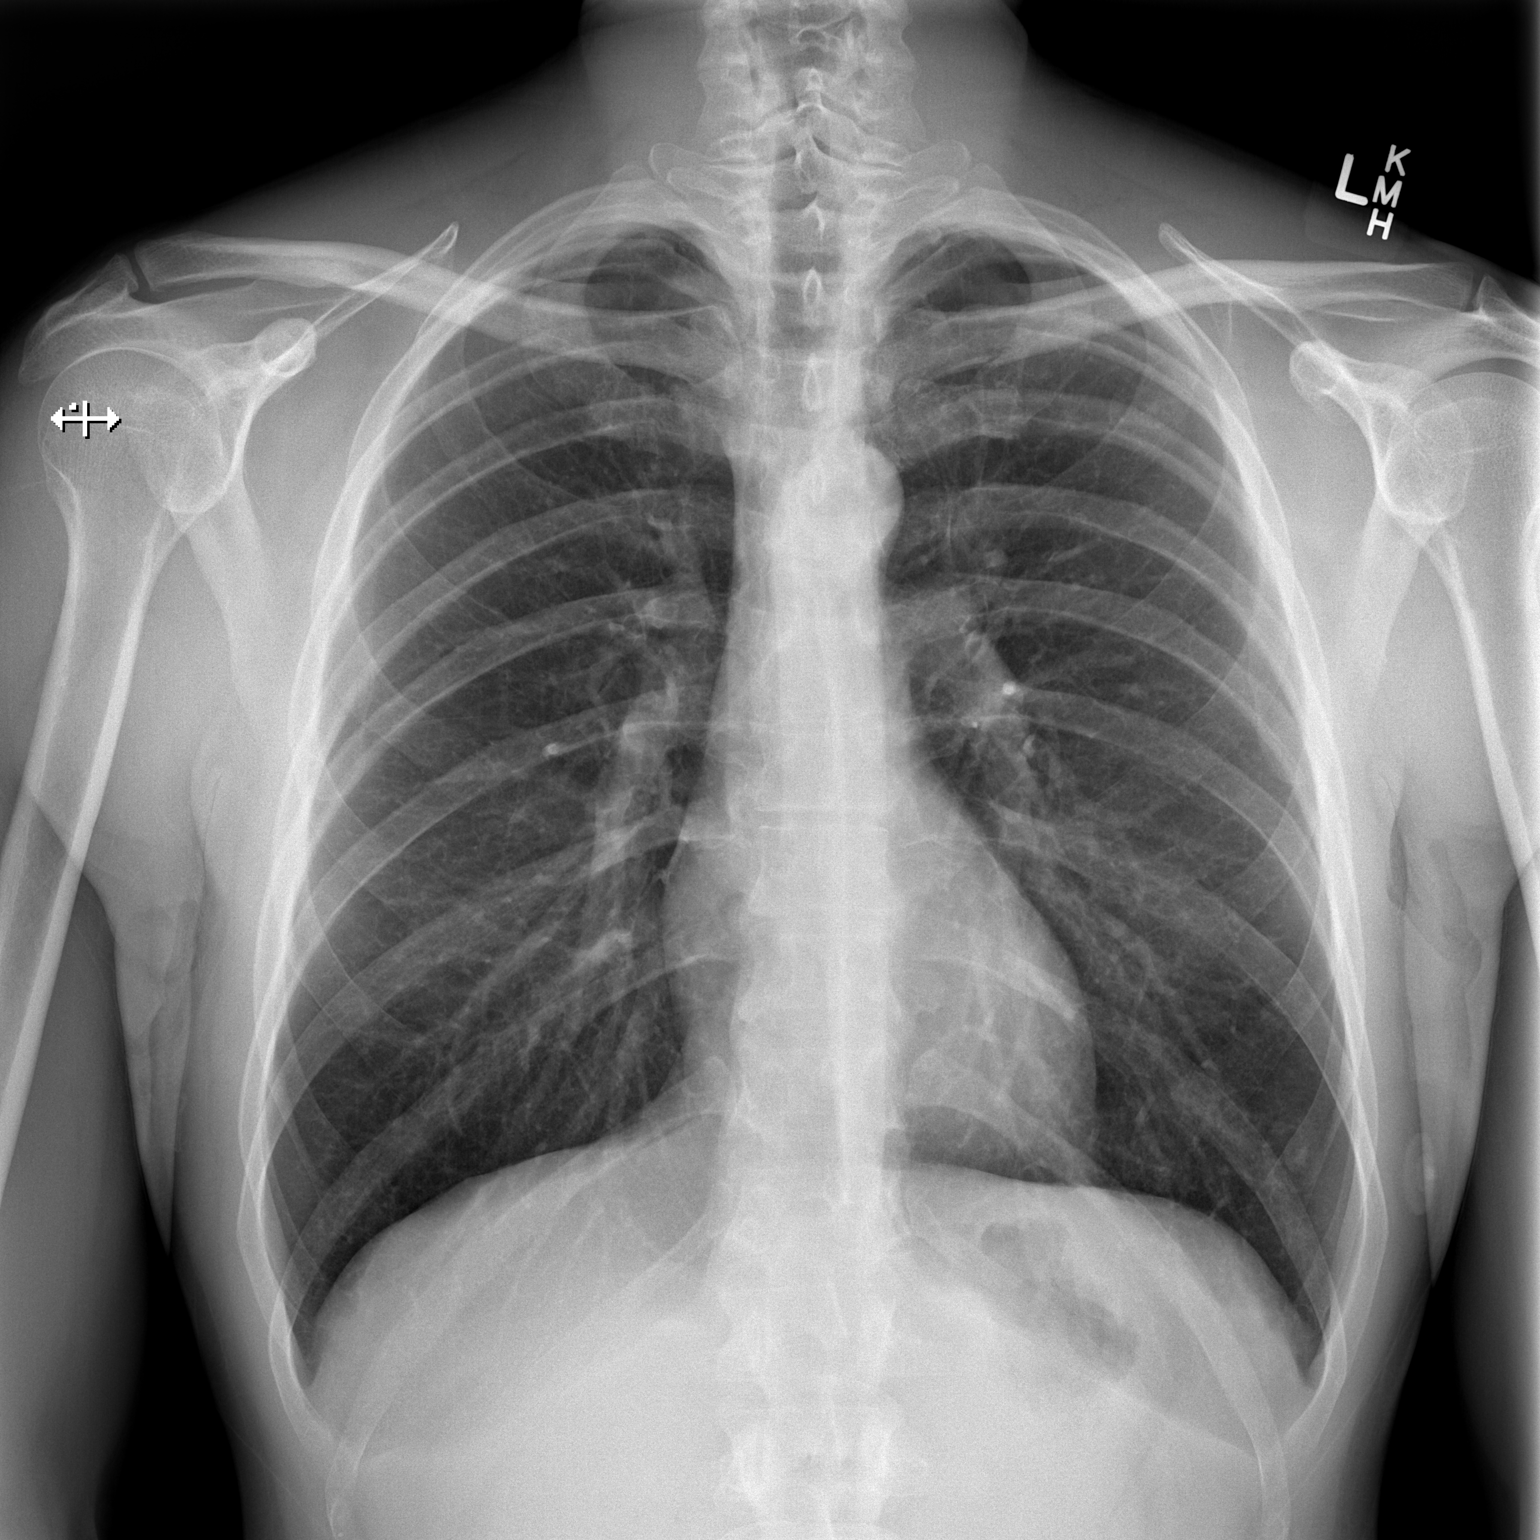

[w chest lat]
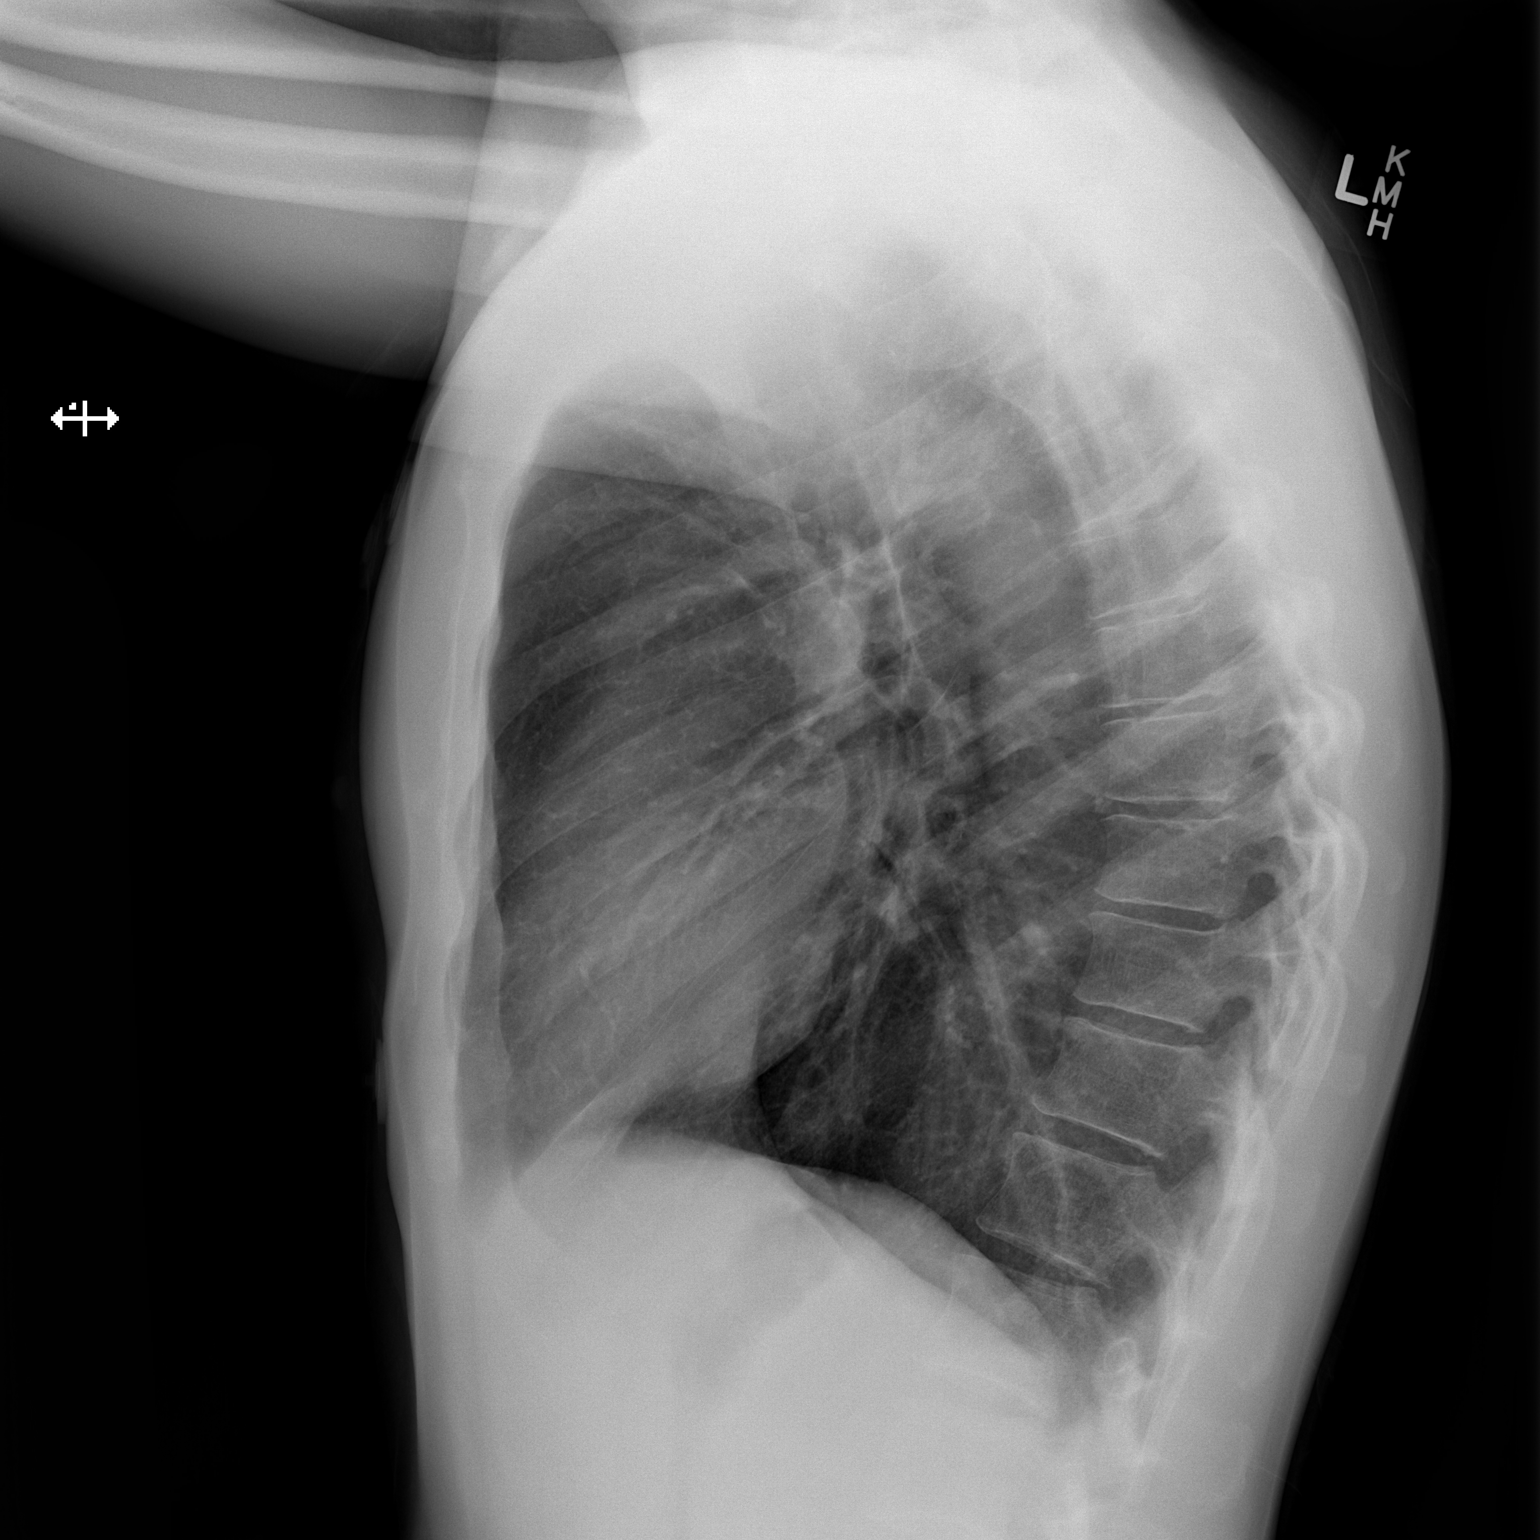

[2 of 2 positions shown; findings below may reference images not displayed]

FINDINGS: Mediastinum hilar structures normal. Lungs are clear. Heart size
normal. No pleural effusion or pneumothorax. No acute bony
abnormality.
IMPRESSION: No acute cardiopulmonary disease.

## 2018-12-03 ENCOUNTER — Encounter: Payer: Self-pay | Admitting: Physician Assistant

## 2018-12-05 ENCOUNTER — Other Ambulatory Visit: Payer: Self-pay | Admitting: Physician Assistant

## 2018-12-05 MED ORDER — HYDROXYZINE HCL 25 MG PO TABS: 25.0000 mg | ORAL_TABLET | Freq: Four times a day (QID) | ORAL | 0 refills | Status: DC | PRN

## 2019-01-09 ENCOUNTER — Other Ambulatory Visit: Payer: Self-pay | Admitting: Cardiology

## 2019-06-17 ENCOUNTER — Encounter: Payer: Self-pay | Admitting: Physician Assistant

## 2019-06-18 ENCOUNTER — Other Ambulatory Visit: Payer: Self-pay | Admitting: Physician Assistant

## 2019-06-18 MED ORDER — BUSPIRONE HCL 5 MG PO TABS: 5.0000 mg | ORAL_TABLET | Freq: Three times a day (TID) | ORAL | 2 refills | Status: DC

## 2019-06-18 NOTE — Telephone Encounter (Signed)
Please see message and advise if okay to refill?

## 2019-07-10 ENCOUNTER — Other Ambulatory Visit: Payer: Self-pay | Admitting: Physician Assistant

## 2019-09-16 ENCOUNTER — Encounter: Payer: Self-pay | Admitting: Physician Assistant

## 2019-11-21 ENCOUNTER — Telehealth: Payer: Self-pay | Admitting: Physician Assistant

## 2019-11-21 ENCOUNTER — Encounter: Payer: Self-pay | Admitting: Physician Assistant

## 2019-11-21 NOTE — Telephone Encounter (Signed)
Copied from [place] [id number]. Topic: General - Other >> Nov 21, 2019  4:07 PM Leward Quan A wrote: Reason for CRM: Patient called to inform Inda Coke that he is experiencing some anxiety issues. And he was wondering if he could get a call back today. Ph#  [phone]

## 2019-11-21 NOTE — Telephone Encounter (Signed)
Spoke to pt told him Jonathan Mcguire is gone for the day. Pt c/o tachycardia feels his heart pounding past 3 days, some chest pressure. Pt has tried the Metoprolol and is not helping. Pt said it is the same as before his anxiety. Told pt unfortunately no one can see you today. I would really like you to go to Urgent care to be evaluated. Pt said he has an appt on Monday first thing with [name]. Asked pt if having any headaches, SOB or numbness or tingling down left arm? Pt denied all symptoms feels anxiety related but not sure. Encouraged pt to go to Urgent care if symptoms persist or get worse so they can do an EKG. Pt verbalized understanding.

## 2019-11-24 ENCOUNTER — Ambulatory Visit (INDEPENDENT_AMBULATORY_CARE_PROVIDER_SITE_OTHER): Payer: BC Managed Care – PPO | Admitting: Physician Assistant

## 2019-11-24 ENCOUNTER — Encounter: Payer: Self-pay | Admitting: Physician Assistant

## 2019-11-24 DIAGNOSIS — F419 Anxiety disorder, unspecified: Secondary | ICD-10-CM

## 2019-11-24 MED ORDER — ESCITALOPRAM OXALATE 5 MG PO TABS: 5.0000 mg | ORAL_TABLET | Freq: Every day | ORAL | 1 refills | Status: DC

## 2019-11-24 MED ORDER — LORAZEPAM 0.5 MG PO TABS: 0.5000 mg | ORAL_TABLET | Freq: Every evening | ORAL | 0 refills | Status: DC | PRN

## 2019-11-24 NOTE — Progress Notes (Signed)
Virtual Visit via Video   I connected with Jonathan Mcguire on 11/24/19 at 10:00 AM EST by a video enabled telemedicine application and verified that I am speaking with the correct person using two identifiers. Location patient: Home Location provider: [facility], Office Persons participating in the virtual visit: Jonn, Chaikin PA-C, Corky Mull, LPN   I discussed the limitations of evaluation and management by telemedicine and the availability of in person appointments. The patient expressed understanding and agreed to proceed.  I acted as a Neurosurgeon for [employer], PA-C [employer], LPN  Subjective:   HPI:   Anxiety On Friday, patient called into the office and noted that he was having an increase in anxiety for the past fews days. Pt c/o tachycardia feels his heart pounding past 3 days, some chest pressure. Pt has tried the Metoprolol and is not helping. Our nurse directed him to Urgent Care which he did go to. He went to Urgent Care on Friday and had a normal EKG and physical exam. He was told that he was having anxiety and he was given ativan 0.5 mg prn and recommended to consider taking lexapro.  Since that time he has started taking his Metoprolol 25 mg daily in AM (prior was only taking 12.5 mg daily) and his ativan. He started taking 1/2 a tablet of Ativan at night, and then takes the other half an hour later. He is still having symptoms despite this; symptoms are improved, but overall still present in AM and worsens as day progresses.  Denies: SOB, radiating pain, unusual HA, chest pain on exertion  Has tried Buspar and Zoloft in the past without significant benefit. Zoloft caused dizziness/lightheadedness, but he admits he only took for about 1 week or so.  ROS: See pertinent positives and negatives per HPI.  Patient Active Problem List   Diagnosis Date Noted  . Abnormal echocardiogram 12/17/2017  . Palpitations 12/01/2017  . Essential  hypertension 01/22/2017    Social History   Tobacco Use  . Smoking status: Never Smoker  . Smokeless tobacco: Never Used  Substance Use Topics  . Alcohol use: Yes    Alcohol/week: 3.0 - 5.0 standard drinks    Types: 2 - 3 Cans of beer, 1 - 2 Glasses of wine per week    Current Outpatient Medications:  .  escitalopram (LEXAPRO) 5 MG tablet, Take 1 tablet (5 mg total) by mouth daily., Disp: 30 tablet, Rfl: 1 .  LORazepam (ATIVAN) 0.5 MG tablet, Take 1 tablet (0.5 mg total) by mouth at bedtime as needed for anxiety., Disp: 30 tablet, Rfl: 0 .  metoprolol succinate (TOPROL-XL) 25 MG 24 hr tablet, Take 0.5 tablets (12.5 mg total) by mouth daily., Disp: 45 tablet, Rfl: 3  No Known Allergies  Objective:   VITALS: Per patient if applicable, see vitals. GENERAL: Alert, appears well and in no acute distress. HEENT: Atraumatic, conjunctiva clear, no obvious abnormalities on inspection of external nose and ears. NECK: Normal movements of the head and neck. CARDIOPULMONARY: No increased WOB. Speaking in clear sentences. I:E ratio WNL.  MS: Moves all visible extremities without noticeable abnormality. PSYCH: Pleasant and cooperative, well-groomed. Speech normal rate and rhythm. Affect is appropriate. Insight and judgement are appropriate. Attention is focused, linear, and appropriate.  NEURO: CN grossly intact. Oriented as arrived to appointment on time with no prompting. Moves both UE equally.  SKIN: No obvious lesions, wounds, erythema, or cyanosis noted on face or hands.  Assessment and Plan:  Jonathan Mcguire was seen today for anxiety.  Diagnoses and all orders for this visit:  Anxiety Uncontrolled. Will trial 1/2 tablet lexapro (2.5 mg) daily. Follow-up with me scheduled in 1 month. Will also prescribe refill of 0.5 mg ativan for him to use in the interim. He is aware that this is a controlled substance and to not abuse this medication. Worsening precautions advised.  Other orders -      escitalopram (LEXAPRO) 5 MG tablet; Take 1 tablet (5 mg total) by mouth daily. -     LORazepam (ATIVAN) 0.5 MG tablet; Take 1 tablet (0.5 mg total) by mouth at bedtime as needed for anxiety.  . Reviewed expectations re: course of current medical issues. . Discussed self-management of symptoms. . Outlined signs and symptoms indicating need for more acute intervention. . Patient verbalized understanding and all questions were answered. Marland Kitchen Health Maintenance issues including appropriate healthy diet, exercise, and smoking avoidance were discussed with patient. . See orders for this visit as documented in the electronic medical record.  I discussed the assessment and treatment plan with the patient. The patient was provided an opportunity to ask questions and all were answered. The patient agreed with the plan and demonstrated an understanding of the instructions.   The patient was advised to call back or seek an in-person evaluation if the symptoms worsen or if the condition fails to improve as anticipated.   CMA or LPN served as scribe during this visit. History, Physical, and Plan performed by medical provider. The above documentation has been reviewed and is accurate and complete.  I spent 25 minutes with this patient, greater than 50% was face-to-face time counseling regarding the above diagnoses.  [place], [place] 11/24/2019

## 2019-12-16 ENCOUNTER — Other Ambulatory Visit: Payer: Self-pay | Admitting: Physician Assistant

## 2019-12-23 ENCOUNTER — Ambulatory Visit (INDEPENDENT_AMBULATORY_CARE_PROVIDER_SITE_OTHER): Payer: BC Managed Care – PPO | Admitting: Physician Assistant

## 2019-12-23 ENCOUNTER — Encounter: Payer: Self-pay | Admitting: Physician Assistant

## 2019-12-23 ENCOUNTER — Other Ambulatory Visit: Payer: Self-pay

## 2019-12-23 VITALS — BP 160/100 | HR 99 | Temp 98.3°F | Ht 68.0 in | Wt 136.4 lb

## 2019-12-23 DIAGNOSIS — I10 Essential (primary) hypertension: Secondary | ICD-10-CM

## 2019-12-23 DIAGNOSIS — R7309 Other abnormal glucose: Secondary | ICD-10-CM | POA: Diagnosis not present

## 2019-12-23 DIAGNOSIS — F419 Anxiety disorder, unspecified: Secondary | ICD-10-CM

## 2019-12-23 DIAGNOSIS — Z23 Encounter for immunization: Secondary | ICD-10-CM

## 2019-12-23 DIAGNOSIS — Z136 Encounter for screening for cardiovascular disorders: Secondary | ICD-10-CM | POA: Diagnosis not present

## 2019-12-23 DIAGNOSIS — Z13220 Encounter for screening for lipoid disorders: Secondary | ICD-10-CM

## 2019-12-23 DIAGNOSIS — Z0000 Encounter for general adult medical examination without abnormal findings: Secondary | ICD-10-CM

## 2019-12-23 MED ORDER — LORAZEPAM 0.5 MG PO TABS: 0.5000 mg | ORAL_TABLET | Freq: Every evening | ORAL | 0 refills | Status: DC | PRN

## 2019-12-23 NOTE — Addendum Note (Signed)
Addended by: Jimmye Norman on: 12/23/2019 03:50 PM   Modules accepted: Orders

## 2019-12-23 NOTE — Patient Instructions (Signed)

## 2019-12-23 NOTE — Progress Notes (Signed)
I acted as a Neurosurgeon for [employer], PA-C Corky Mull, LPN  Subjective:    Jonathan Mcguire is a 49 y.o. male and is here for a comprehensive physical exam.  HPI  Health Maintenance Due  Topic Date Due  . TETANUS/TDAP  12/22/2018    Acute Concerns: None  Chronic Issues: Anxiety/Insomnia -- currently on Lexapro 5 mg daily. A few days ago, he noticed that his physical symptoms of anxiety has improved. Was taking 1/2 tablet of ativan for sleep and is taking 5-6 nights a week at this point.   Health Maintenance: Immunizations -- UTD, will give Tdap today Colonoscopy -- N/A PSA -- N/A Diet -- regular diet Sleep habits -- does have to take  Exercise -- started running in November; but is currently taking a break due to recent mood issues; walks consistently and briskly Weight -- Weight: 136 lb 6.1 oz (61.9 kg)  Weight history Wt Readings from Last 10 Encounters:  12/23/19 136 lb 6.1 oz (61.9 kg)  11/12/18 136 lb 8 oz (61.9 kg)  10/23/18 137 lb (62.1 kg)  10/01/18 134 lb 3.2 oz (60.9 kg)  03/27/18 140 lb 9.6 oz (63.8 kg)  02/22/18 140 lb 6.4 oz (63.7 kg)  01/28/18 142 lb (64.4 kg)  12/17/17 143 lb 6.4 oz (65 kg)  12/05/17 142 lb 9.6 oz (64.7 kg)  02/22/17 141 lb (64 kg)  Body mass index is 20.74 kg/m. Mood -- improving Tobacco use -- none Alcohol use --- very limited  Depression screen PHQ 2/9 01/28/2018  Decreased Interest 0  Down, Depressed, Hopeless 0  PHQ - 2 Score 0     Other providers/specialists: Patient Care Team: Jarold Motto, [place] as PCP - General (Physician Assistant) Marykay Lex, MD as PCP - Cardiology (Cardiology) Dr. Wynell Balloon (Ophthalmology) Dr. Dannielle Burn (Dentistry)   PMHx, SurgHx, SocialHx, Medications, and Allergies were reviewed in the Visit Navigator and updated as appropriate.   Past Medical History:  Diagnosis Date  . Essential hypertension   . MIld Tricuspid regurgitation    a. 02/2017 Echo: EF 60-65%, no rwma,  nl LA/RA size, nl RV fxn, mild TR.  Marland Kitchen Palpitations   . Raynaud disease      Past Surgical History:  Procedure Laterality Date  . FRACTURE SURGERY Left 1987   Left leg, compound fx  . TONSILLECTOMY Bilateral 1980  . TRANSTHORACIC ECHOCARDIOGRAM  12/2017   NORMAL Study.  EF 60-65% with no regional wall motion normalities and no valvular lesions.  (Notably no evidence of Ebstein's anomaly.     Family History  Problem Relation Age of Onset  . Hypertension Mother   . Cancer Father        ?neck  . Cancer Maternal Grandmother        pancreatic  . Heart disease Maternal Grandfather   . Stroke Maternal Grandfather        Tobacco abuse  . Cancer - Other Paternal Grandmother        Kidney  . Colon cancer Neg Hx     Social History   Tobacco Use  . Smoking status: Never Smoker  . Smokeless tobacco: Never Used  Substance Use Topics  . Alcohol use: Yes    Alcohol/week: 3.0 - 5.0 standard drinks    Types: 2 - 3 Cans of beer, 1 - 2 Glasses of wine per week  . Drug use: No    Review of Systems:   Review of Systems  Constitutional: Negative for chills, fever, malaise/fatigue  and weight loss.  HENT: Negative for hearing loss, sinus pain and sore throat.   Respiratory: Negative for cough and hemoptysis.   Cardiovascular: Negative for chest pain, palpitations, leg swelling and PND.  Gastrointestinal: Negative for abdominal pain, constipation, diarrhea, heartburn, nausea and vomiting.  Genitourinary: Negative for dysuria, frequency and urgency.  Musculoskeletal: Negative for back pain, myalgias and neck pain.  Skin: Negative for itching and rash.  Neurological: Negative for dizziness, tingling, seizures and headaches.  Endo/Heme/Allergies: Negative for polydipsia.  Psychiatric/Behavioral: Negative for depression. The patient is not nervous/anxious.     Objective:   Vitals:   12/23/19 1414  BP: (!) 160/100  Pulse: 99  Temp: 98.3 F (36.8 C)  SpO2: 98%   Body mass index is  20.74 kg/m.  General Appearance:  Alert, cooperative, no distress, appears stated age  Head:  Normocephalic, without obvious abnormality, atraumatic  Eyes:  PERRL, conjunctiva/corneas clear, EOM's intact, fundi benign, both eyes       Ears:  Normal TM's and external ear canals, both ears  Nose: Nares normal, septum midline, mucosa normal, no drainage    or sinus tenderness  Throat: Lips, mucosa, and tongue normal; teeth and gums normal  Neck: Supple, symmetrical, trachea midline, no adenopathy; thyroid:  No enlargement/tenderness/nodules; no carotit bruit or JVD  Back:   Symmetric, no curvature, ROM normal, no CVA tenderness  Lungs:   Clear to auscultation bilaterally, respirations unlabored  Chest wall:  No tenderness or deformity  Heart:  Regular rate and rhythm, S1 and S2 normal, no murmur, rub   or gallop  Abdomen:   Soft, non-tender, bowel sounds active all four quadrants, no masses, no organomegaly  Extremities: Extremities normal, atraumatic, no cyanosis or edema  Prostate: Not done.   Skin: Skin color, texture, turgor normal, no rashes or lesions  Lymph nodes: Cervical, supraclavicular, and axillary nodes normal  Neurologic: CNII-XII grossly intact. Normal strength, sensation and reflexes throughout    Assessment/Plan:   Jonathan Mcguire was seen today for annual exam.  Diagnoses and all orders for this visit:  Routine physical examination Today patient counseled on age appropriate routine health concerns for screening and prevention, each reviewed and up to date or declined. Immunizations reviewed and up to date or declined. Labs ordered and reviewed. Risk factors for depression reviewed and negative. Hearing function and visual acuity are intact. ADLs screened and addressed as needed. Functional ability and level of safety reviewed and appropriate. Education, counseling and referrals performed based on assessed risks today. Patient provided with a copy of personalized plan for  preventive services.  Anxiety Improving with time. Continue Lexapro 5 mg daily at least for another 2-3 weeks to see if he has any further benefit from this before he increases medication. Recommend trying to take this in the morning instead of night to help with insomnia. Ativan as needed for sleep, however we had a discussion about possibly needing to change this if he continues to need to use this most nights of the week for sleep. -     CBC with Differential/Platelet -     Comprehensive metabolic panel  Essential hypertension History of elevation when in office. Asymptomatic, no further changes at this time. -     CBC with Differential/Platelet -     Comprehensive metabolic panel  Encounter for lipid screening for cardiovascular disease -     Lipid panel  Well Adult Exam: Labs ordered: Yes. Patient counseling was done. See below for items discussed. Discussed the patient's BMI.  The BMI is in the acceptable range Follow up in 3 months.  Patient Counseling: [x]   Nutrition: Stressed importance of moderation in sodium/caffeine intake, saturated fat and cholesterol, caloric balance, sufficient intake of fresh fruits, vegetables, and fiber.  [x]   Stressed the importance of regular exercise.   []   Substance Abuse: Discussed cessation/primary prevention of tobacco, alcohol, or other drug use; driving or other dangerous activities under the influence; availability of treatment for abuse.   [x]   Injury prevention: Discussed safety belts, safety helmets, smoke detector, smoking near bedding or upholstery.   []   Sexuality: Discussed sexually transmitted diseases, partner selection, use of condoms, avoidance of unintended pregnancy  and contraceptive alternatives.   [x]   Dental health: Discussed importance of regular tooth brushing, flossing, and dental visits.  [x]   Health maintenance and immunizations reviewed. Please refer to Health maintenance section.    CMA or LPN served as scribe during this  visit. History, Physical, and Plan performed by medical provider. The above documentation has been reviewed and is accurate and complete.  Inda Coke, PA-C [place]

## 2019-12-24 ENCOUNTER — Other Ambulatory Visit: Payer: BC Managed Care – PPO

## 2019-12-24 ENCOUNTER — Other Ambulatory Visit: Payer: Self-pay | Admitting: Physician Assistant

## 2019-12-24 DIAGNOSIS — R7309 Other abnormal glucose: Secondary | ICD-10-CM

## 2019-12-24 LAB — LIPID PANEL
Cholesterol: 211 mg/dL — ABNORMAL HIGH (ref 0–200)
HDL: 79.7 mg/dL (ref >39.00)
LDL Cholesterol: 115 mg/dL — ABNORMAL HIGH (ref 0–99)
NonHDL: 130.91
Total CHOL/HDL Ratio: 3
Triglycerides: 78 mg/dL (ref 0.0–149.0)
VLDL: 15.6 mg/dL (ref 0.0–40.0)

## 2019-12-24 LAB — CBC WITH DIFFERENTIAL/PLATELET
Basophils Absolute: 0.1 10*3/uL (ref 0.0–0.1)
Basophils Relative: 1.2 % (ref 0.0–3.0)
Eosinophils Absolute: 0.1 10*3/uL (ref 0.0–0.7)
Eosinophils Relative: 1 % (ref 0.0–5.0)
HCT: 45.7 % (ref 39.0–52.0)
Hemoglobin: 15.6 g/dL (ref 13.0–17.0)
Lymphocytes Relative: 24.7 % (ref 12.0–46.0)
Lymphs Abs: 1.7 10*3/uL (ref 0.7–4.0)
MCHC: 34.1 g/dL (ref 30.0–36.0)
MCV: 87.5 fl (ref 78.0–100.0)
Monocytes Absolute: 0.4 10*3/uL (ref 0.1–1.0)
Monocytes Relative: 5.8 % (ref 3.0–12.0)
Neutro Abs: 4.7 10*3/uL (ref 1.4–7.7)
Neutrophils Relative %: 67.3 % (ref 43.0–77.0)
Platelets: 212 10*3/uL (ref 150.0–400.0)
RBC: 5.23 Mil/uL (ref 4.22–5.81)
RDW: 13.4 % (ref 11.5–15.5)
WBC: 6.9 10*3/uL (ref 4.0–10.5)

## 2019-12-24 LAB — COMPREHENSIVE METABOLIC PANEL
ALT: 19 U/L (ref 0–53)
AST: 20 U/L (ref 0–37)
Albumin: 4.8 g/dL (ref 3.5–5.2)
Alkaline Phosphatase: 35 U/L — ABNORMAL LOW (ref 39–117)
BUN: 17 mg/dL (ref 6–23)
CO2: 29 mEq/L (ref 19–32)
Calcium: 9.7 mg/dL (ref 8.4–10.5)
Chloride: 103 mEq/L (ref 96–112)
Creatinine, Ser: 1.05 mg/dL (ref 0.40–1.50)
GFR: 75.41 mL/min (ref >60.00)
Glucose, Bld: 130 mg/dL — ABNORMAL HIGH (ref 70–99)
Potassium: 3.7 mEq/L (ref 3.5–5.1)
Sodium: 138 mEq/L (ref 135–145)
Total Bilirubin: 0.8 mg/dL (ref 0.2–1.2)
Total Protein: 7.4 g/dL (ref 6.0–8.3)

## 2019-12-24 LAB — HEMOGLOBIN A1C: Hgb A1c MFr Bld: 5.5 % (ref 4.6–6.5)

## 2019-12-24 NOTE — Addendum Note (Signed)
Addended by: Young Berry T on: 12/24/2019 11:53 AM   Modules accepted: Orders

## 2019-12-25 ENCOUNTER — Encounter: Payer: Self-pay | Admitting: Physician Assistant

## 2020-01-26 ENCOUNTER — Encounter: Payer: Self-pay | Admitting: Physician Assistant

## 2020-01-27 ENCOUNTER — Ambulatory Visit (INDEPENDENT_AMBULATORY_CARE_PROVIDER_SITE_OTHER): Payer: BC Managed Care – PPO | Admitting: Physician Assistant

## 2020-01-27 ENCOUNTER — Encounter: Payer: Self-pay | Admitting: Physician Assistant

## 2020-01-27 ENCOUNTER — Other Ambulatory Visit: Payer: Self-pay

## 2020-01-27 VITALS — Ht 68.0 in | Wt 136.4 lb

## 2020-01-27 DIAGNOSIS — F419 Anxiety disorder, unspecified: Secondary | ICD-10-CM | POA: Diagnosis not present

## 2020-01-27 MED ORDER — ESCITALOPRAM OXALATE 10 MG PO TABS: 10.0000 mg | ORAL_TABLET | Freq: Every day | ORAL | 1 refills | Status: DC

## 2020-01-27 NOTE — Progress Notes (Signed)
Virtual Visit via Video   I connected with Jonathan Mcguire on 01/27/20 at 11:20 AM EST by a video enabled telemedicine application and verified that I am speaking with the correct person using two identifiers. Location patient: Home Location provider: [facility], Office Persons participating in the virtual visit: [place], [place] PA-C, Corky Mull, LPN   I discussed the limitations of evaluation and management by telemedicine and the availability of in person appointments. The patient expressed understanding and agreed to proceed.  I acted as a Neurosurgeon for [employer], [employer], LPN  Subjective:   HPI:   Anxiety Pt would like to discuss possibly increasing Lexapro medication. He is currently taking 5 mg and it has helped by about 50%. It has taken the edge off but he is wondering if he could get more benefit from increasing the dosage of the medication. Pt takes Lorazepam only once a week when he can't settle down. Pt is tolerating Lexapro without any side effects. Denies SI/HI.  ROS: See pertinent positives and negatives per HPI.  Patient Active Problem List   Diagnosis Date Noted  . Anxiety 12/23/2019  . Abnormal echocardiogram 12/17/2017  . Palpitations 12/01/2017  . Essential hypertension 01/22/2017    Social History   Tobacco Use  . Smoking status: Never Smoker  . Smokeless tobacco: Never Used  Substance Use Topics  . Alcohol use: Yes    Alcohol/week: 3.0 - 5.0 standard drinks    Types: 2 - 3 Cans of beer, 1 - 2 Glasses of wine per week    Current Outpatient Medications:  .  LORazepam (ATIVAN) 0.5 MG tablet, Take 1 tablet (0.5 mg total) by mouth at bedtime as needed for anxiety., Disp: 30 tablet, Rfl: 0 .  metoprolol succinate (TOPROL-XL) 25 MG 24 hr tablet, Take 0.5 tablets (12.5 mg total) by mouth daily. (Patient taking differently: Take 12.5 mg by mouth as needed. ), Disp: 45 tablet, Rfl: 3 .  escitalopram (LEXAPRO) 10 MG  tablet, Take 1 tablet (10 mg total) by mouth daily., Disp: 60 tablet, Rfl: 1  No Known Allergies  Objective:   VITALS: Per patient if applicable, see vitals. GENERAL: Alert, appears well and in no acute distress. HEENT: Atraumatic, conjunctiva clear, no obvious abnormalities on inspection of external nose and ears. NECK: Normal movements of the head and neck. CARDIOPULMONARY: No increased WOB. Speaking in clear sentences. I:E ratio WNL.  MS: Moves all visible extremities without noticeable abnormality. PSYCH: Pleasant and cooperative, well-groomed. Speech normal rate and rhythm. Affect is appropriate. Insight and judgement are appropriate. Attention is focused, linear, and appropriate.  NEURO: CN grossly intact. Oriented as arrived to appointment on time with no prompting. Moves both UE equally.  SKIN: No obvious lesions, wounds, erythema, or cyanosis noted on face or hands.  Assessment and Plan:   Leonardo was seen today for anxiety.  Diagnoses and all orders for this visit:  Anxiety  Other orders -     escitalopram (LEXAPRO) 10 MG tablet; Take 1 tablet (10 mg total) by mouth daily.   Improved. Will increase Lexapro to 10 mg daily.  Instructed patient to follow-up via MyChart message in 1 month, sooner if concerns.  . Reviewed expectations re: course of current medical issues. . Discussed self-management of symptoms. . Outlined signs and symptoms indicating need for more acute intervention. . Patient verbalized understanding and all questions were answered. Marland Kitchen Health Maintenance issues including appropriate healthy diet, exercise, and smoking avoidance were discussed with patient. Marland Kitchen  See orders for this visit as documented in the electronic medical record.  I discussed the assessment and treatment plan with the patient. The patient was provided an opportunity to ask questions and all were answered. The patient agreed with the plan and demonstrated an understanding of the  instructions.   The patient was advised to call back or seek an in-person evaluation if the symptoms worsen or if the condition fails to improve as anticipated.   CMA or LPN served as scribe during this visit. History, Physical, and Plan performed by medical provider. The above documentation has been reviewed and is accurate and complete.   [place], [place] 01/27/2020

## 2020-02-11 ENCOUNTER — Other Ambulatory Visit: Payer: Self-pay | Admitting: *Deleted

## 2020-02-11 MED ORDER — ESCITALOPRAM OXALATE 10 MG PO TABS: 10.0000 mg | ORAL_TABLET | Freq: Every day | ORAL | 1 refills | Status: DC

## 2020-05-28 ENCOUNTER — Encounter: Payer: Self-pay | Admitting: Physician Assistant

## 2020-06-02 ENCOUNTER — Ambulatory Visit (INDEPENDENT_AMBULATORY_CARE_PROVIDER_SITE_OTHER): Payer: BC Managed Care – PPO | Admitting: Physician Assistant

## 2020-06-02 ENCOUNTER — Other Ambulatory Visit: Payer: Self-pay

## 2020-06-02 ENCOUNTER — Encounter: Payer: Self-pay | Admitting: Physician Assistant

## 2020-06-02 VITALS — BP 178/88 | HR 70 | Temp 98.7°F | Ht 68.0 in | Wt 134.4 lb

## 2020-06-02 DIAGNOSIS — R42 Dizziness and giddiness: Secondary | ICD-10-CM

## 2020-06-02 DIAGNOSIS — R55 Syncope and collapse: Secondary | ICD-10-CM

## 2020-06-02 DIAGNOSIS — I10 Essential (primary) hypertension: Secondary | ICD-10-CM | POA: Diagnosis not present

## 2020-06-02 DIAGNOSIS — R002 Palpitations: Secondary | ICD-10-CM

## 2020-06-02 LAB — CBC WITH DIFFERENTIAL/PLATELET
Basophils Absolute: 0.1 10*3/uL (ref 0.0–0.1)
Basophils Relative: 1 % (ref 0.0–3.0)
Eosinophils Absolute: 0.1 10*3/uL (ref 0.0–0.7)
Eosinophils Relative: 1.6 % (ref 0.0–5.0)
HCT: 47.4 % (ref 39.0–52.0)
Hemoglobin: 16.4 g/dL (ref 13.0–17.0)
Lymphocytes Relative: 29.4 % (ref 12.0–46.0)
Lymphs Abs: 1.7 10*3/uL (ref 0.7–4.0)
MCHC: 34.6 g/dL (ref 30.0–36.0)
MCV: 86.7 fl (ref 78.0–100.0)
Monocytes Absolute: 0.5 10*3/uL (ref 0.1–1.0)
Monocytes Relative: 8.6 % (ref 3.0–12.0)
Neutro Abs: 3.5 10*3/uL (ref 1.4–7.7)
Neutrophils Relative %: 59.4 % (ref 43.0–77.0)
Platelets: 211 10*3/uL (ref 150.0–400.0)
RBC: 5.46 Mil/uL (ref 4.22–5.81)
RDW: 13 % (ref 11.5–15.5)
WBC: 5.9 10*3/uL (ref 4.0–10.5)

## 2020-06-02 LAB — COMPREHENSIVE METABOLIC PANEL
ALT: 20 U/L (ref 0–53)
AST: 21 U/L (ref 0–37)
Albumin: 5.1 g/dL (ref 3.5–5.2)
Alkaline Phosphatase: 41 U/L (ref 39–117)
BUN: 14 mg/dL (ref 6–23)
CO2: 28 mEq/L (ref 19–32)
Calcium: 10.2 mg/dL (ref 8.4–10.5)
Chloride: 103 mEq/L (ref 96–112)
Creatinine, Ser: 1 mg/dL (ref 0.40–1.50)
GFR: 79.63 mL/min (ref >60.00)
Glucose, Bld: 98 mg/dL (ref 70–99)
Potassium: 4.4 mEq/L (ref 3.5–5.1)
Sodium: 138 mEq/L (ref 135–145)
Total Bilirubin: 0.9 mg/dL (ref 0.2–1.2)
Total Protein: 7.8 g/dL (ref 6.0–8.3)

## 2020-06-02 NOTE — Patient Instructions (Signed)
It was great to see you!  I will be in touch with your lab results.  Push the fluids. Drink electrolytes (gatorade or powerade) if you are outside > 45 min.  You will be contacted about your referral to cardiology and neurology.  Take care,  Jarold Motto PA-C

## 2020-06-02 NOTE — Progress Notes (Signed)
Jonathan Mcguire is a 49 y.o. male is here to discuss: anxiety  I acted as a Neurosurgeon for [employer], PA-C Gracy Racer, [place]  History of Present Illness:   Chief Complaint  Patient presents with  . Anxiety    HPI  Patient is present with his wife, Herbert Seta.  Lightheadedness; Syncope Lexapro was increased to 10 mg daily at our last visit in Feb 2021. Patient discontinued Lexapro in March due to side effects, states that his symptoms seemed to be heightened after he increased his medication.   He had an unwitnessed fainting spell about a week and a half ago. He was doing yardwork -- bending over doing weeding. All of a sudden had tunnel vision and passed out. He is not sure how long he was down for. Woke up with a sore jaw, bite marks on his tongue, and feeling a little out of it. No urinary/bowel incontinence. Wife reports that evening before he had a few beers and the morning of he had only coffee -- she suspects dehydration. He also had b/l leg cramps after this episode.  He reports since this event he has had constant lightheadedness.. Having some palpitations. Has taken the metoprolol once or twice since Sunday and did not notice much improvement.   Has not seen cardiology since 2019. Had essentially negative work-up during that time -- including EKG, echo and holter monitor.  Denies: chest pain, SOB, vision changes (last vision exam in fall 2020 and WNL)  He has not had COVID to his knowledge and is not vaccinated currently.   GAD 7 : Generalized Anxiety Score 06/02/2020 01/27/2020 12/23/2019 10/23/2018  Nervous, Anxious, on Edge 1 1 2 2   Control/stop worrying 1 1 1 1   Worry too much - different things 1 1 1 1   Trouble relaxing 1 0 2 2  Restless 1 1 2 2   Easily annoyed or irritable 1 0 1 0  Afraid - awful might happen 0 1 2 0  Total GAD 7 Score 6 5 11 8   Anxiety Difficulty Somewhat difficult Not difficult at all Not difficult at all Not difficult at all      Health  Maintenance Due  Topic Date Due  . Hepatitis C Screening  Never done  . COVID-19 Vaccine (1) Never done    Past Medical History:  Diagnosis Date  . Essential hypertension   . MIld Tricuspid regurgitation    a. 02/2017 Echo: EF 60-65%, no rwma, nl LA/RA size, nl RV fxn, mild TR.  Palpitations   . Raynaud disease      Social History   Tobacco Use  . Smoking status: Never Smoker  . Smokeless tobacco: Never Used  Substance Use Topics  . Alcohol use: Yes    Alcohol/week: 3.0 - 5.0 standard drinks    Types: 2 - 3 Cans of beer, 1 - 2 Glasses of wine per week  . Drug use: No    Past Surgical History:  Procedure Laterality Date  . FRACTURE SURGERY Left 1987   Left leg, compound fx  . TONSILLECTOMY Bilateral 1980  . TRANSTHORACIC ECHOCARDIOGRAM  12/2017   NORMAL Study.  EF 60-65% with no regional wall motion normalities and no valvular lesions.  (Notably no evidence of Ebstein's anomaly.    Family History  Problem Relation Age of Onset  . Hypertension Mother   . Cancer Father        ?neck  . Cancer Maternal Grandmother  pancreatic  . Heart disease Maternal Grandfather   . Stroke Maternal Grandfather        Tobacco abuse  . Cancer - Other Paternal Grandmother        Kidney  . Colon cancer Neg Hx     PMHx, SurgHx, SocialHx, FamHx, Medications, and Allergies were reviewed in the Visit Navigator and updated as appropriate.   Patient Active Problem List   Diagnosis Date Noted  . Anxiety 12/23/2019  . Abnormal echocardiogram 12/17/2017  . Palpitations 12/01/2017  . Essential hypertension 01/22/2017    Social History   Tobacco Use  . Smoking status: Never Smoker  . Smokeless tobacco: Never Used  Substance Use Topics  . Alcohol use: Yes    Alcohol/week: 3.0 - 5.0 standard drinks    Types: 2 - 3 Cans of beer, 1 - 2 Glasses of wine per week  . Drug use: No    Current Medications and Allergies:    Current Outpatient Medications:  .  LORazepam (ATIVAN)  0.5 MG tablet, Take 1 tablet (0.5 mg total) by mouth at bedtime as needed for anxiety., Disp: 30 tablet, Rfl: 0 .  metoprolol succinate (TOPROL-XL) 25 MG 24 hr tablet, Take 0.5 tablets (12.5 mg total) by mouth daily. (Patient taking differently: Take 12.5 mg by mouth as needed. ), Disp: 45 tablet, Rfl: 3  No Known Allergies  Review of Systems   ROS  Negative unless otherwise specified per HPI.  Vitals:   Vitals:   06/02/20 1029  BP: (!) 178/88  Pulse: 70  Temp: 98.7 F (37.1 C)  TempSrc: Temporal  SpO2: 97%  Weight: 134 lb 6.4 oz (61 kg)  Height: 5\' 8"  (1.727 m)     Body mass index is 20.44 kg/m.   Physical Exam:    Physical Exam Vitals and nursing note reviewed.  Constitutional:      General: He is not in acute distress.    Appearance: He is well-developed. He is not ill-appearing or toxic-appearing.  Cardiovascular:     Rate and Rhythm: Normal rate and regular rhythm.     Pulses: Normal pulses.     Heart sounds: Normal heart sounds, S1 normal and S2 normal.     Comments: No LE edema Pulmonary:     Effort: Pulmonary effort is normal.     Breath sounds: Normal breath sounds.  Skin:    General: Skin is warm and dry.  Neurological:     Mental Status: He is alert.     GCS: GCS eye subscore is 4. GCS verbal subscore is 5. GCS motor subscore is 6.  Psychiatric:        Speech: Speech normal.        Behavior: Behavior normal. Behavior is cooperative.      Assessment and Plan:    Ryelan was seen today for anxiety.  Diagnoses and all orders for this visit:  Palpitations; Lightheadedness Given recent resurgence of lightheadedness, palpitations and syncopal event, will refer back to cardiology for further insight. -     CBC with Differential/Platelet -     Comprehensive metabolic panel  Syncope, unspecified syncope type Discussed need for adequate hydration and electrolyte replacement when patient is outdoors for prolonged periods. Will refer to neurology  given tongue-biting episode and syncopal event.  -     Ambulatory referral to Neurology  . Reviewed expectations re: course of current medical issues. . Discussed self-management of symptoms. . Outlined signs and symptoms indicating need for more acute intervention. Marland Kitchen  Patient verbalized understanding and all questions were answered. . See orders for this visit as documented in the electronic medical record. . Patient received an After Visit Summary.  CMA or LPN served as scribe during this visit. History, Physical, and Plan performed by medical provider. The above documentation has been reviewed and is accurate and complete.  I spent >45 minutes with this patient and his wife, greater than 50% was face-to-face time counseling regarding the above diagnoses.   Jarold Motto, PA-C [place], [place] 06/02/2020  Follow-up: No follow-ups on file.

## 2020-07-09 ENCOUNTER — Ambulatory Visit: Payer: BC Managed Care – PPO | Admitting: Cardiology

## 2020-07-31 IMAGING — DX DG ABDOMEN 2V
2 series · 2 of 2 positions shown · non-contrast
Comparison: None.

CLINICAL DATA: Lower abdominal pain.  Diarrhea.

EXAM:
ABDOMEN - 2 VIEW

[abdomen standing ap]
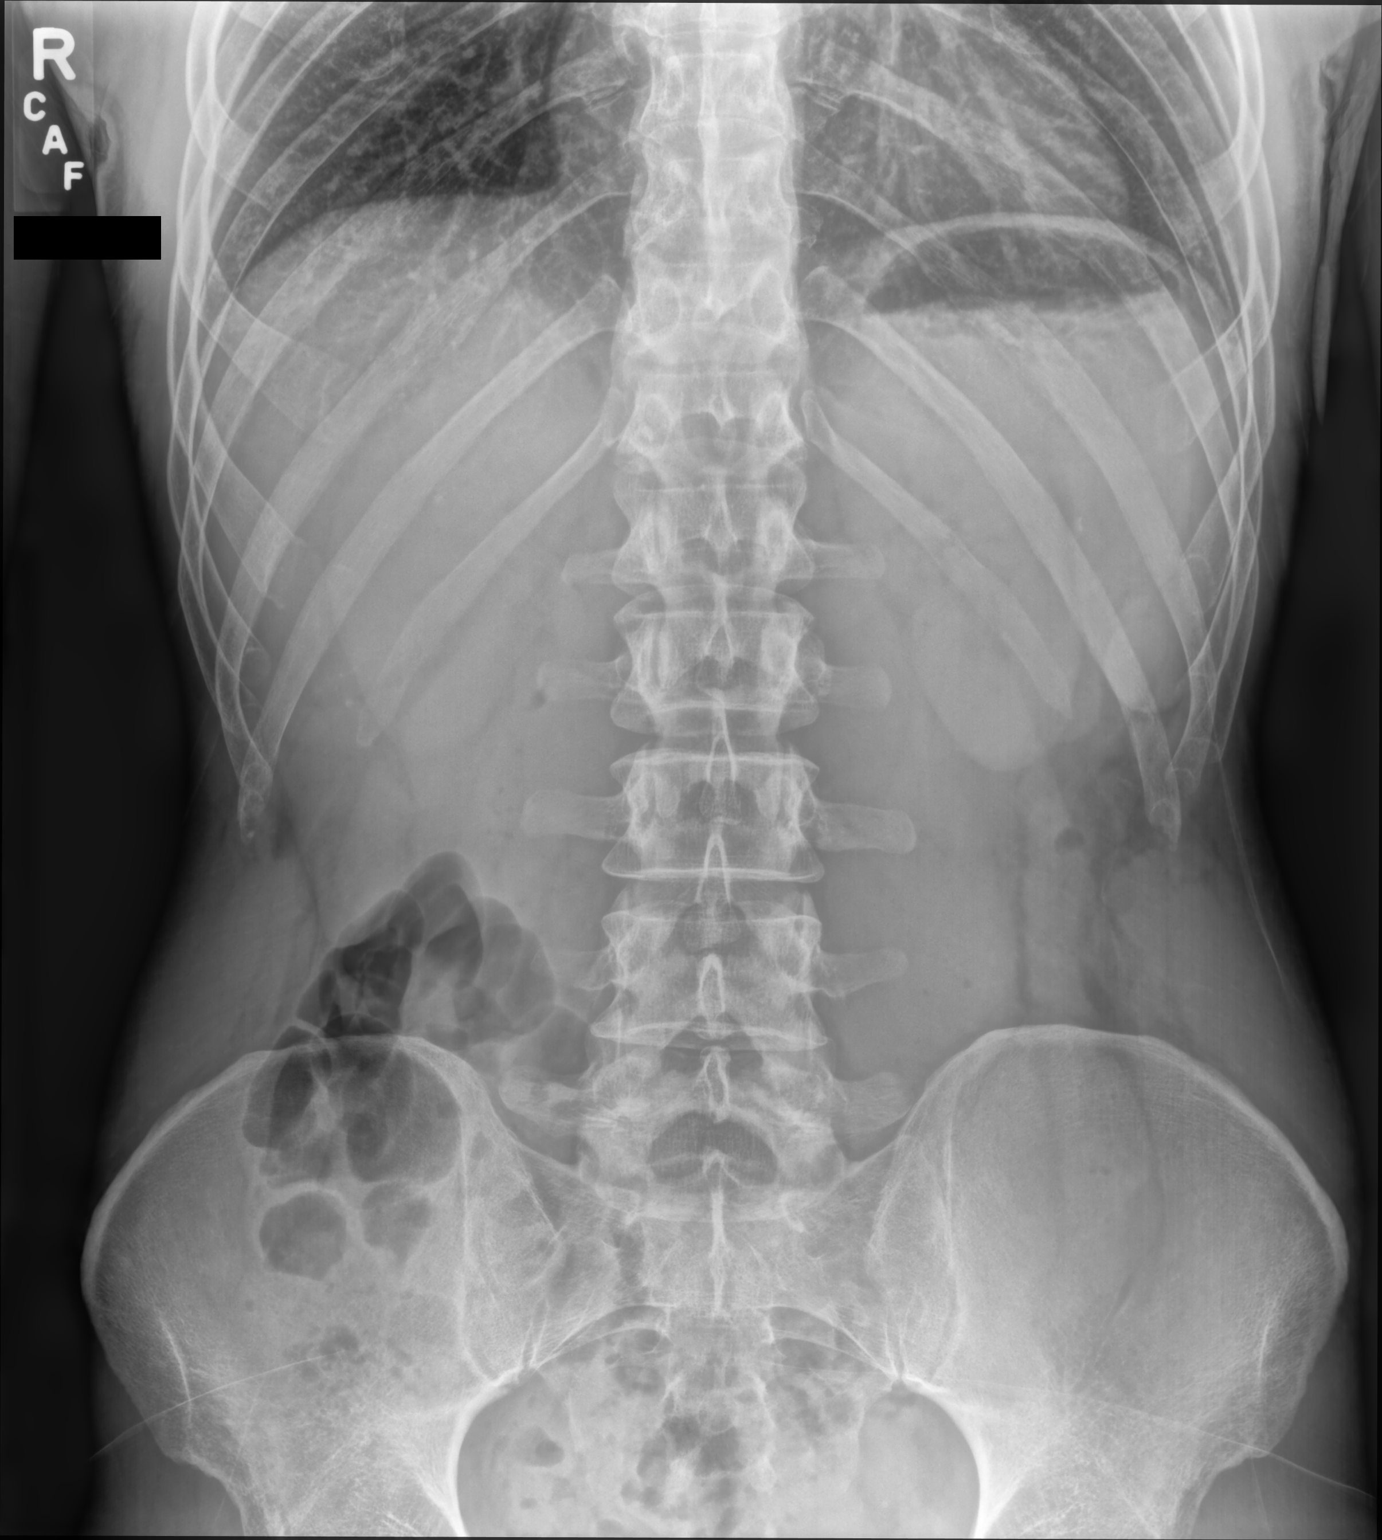

[kub ap]
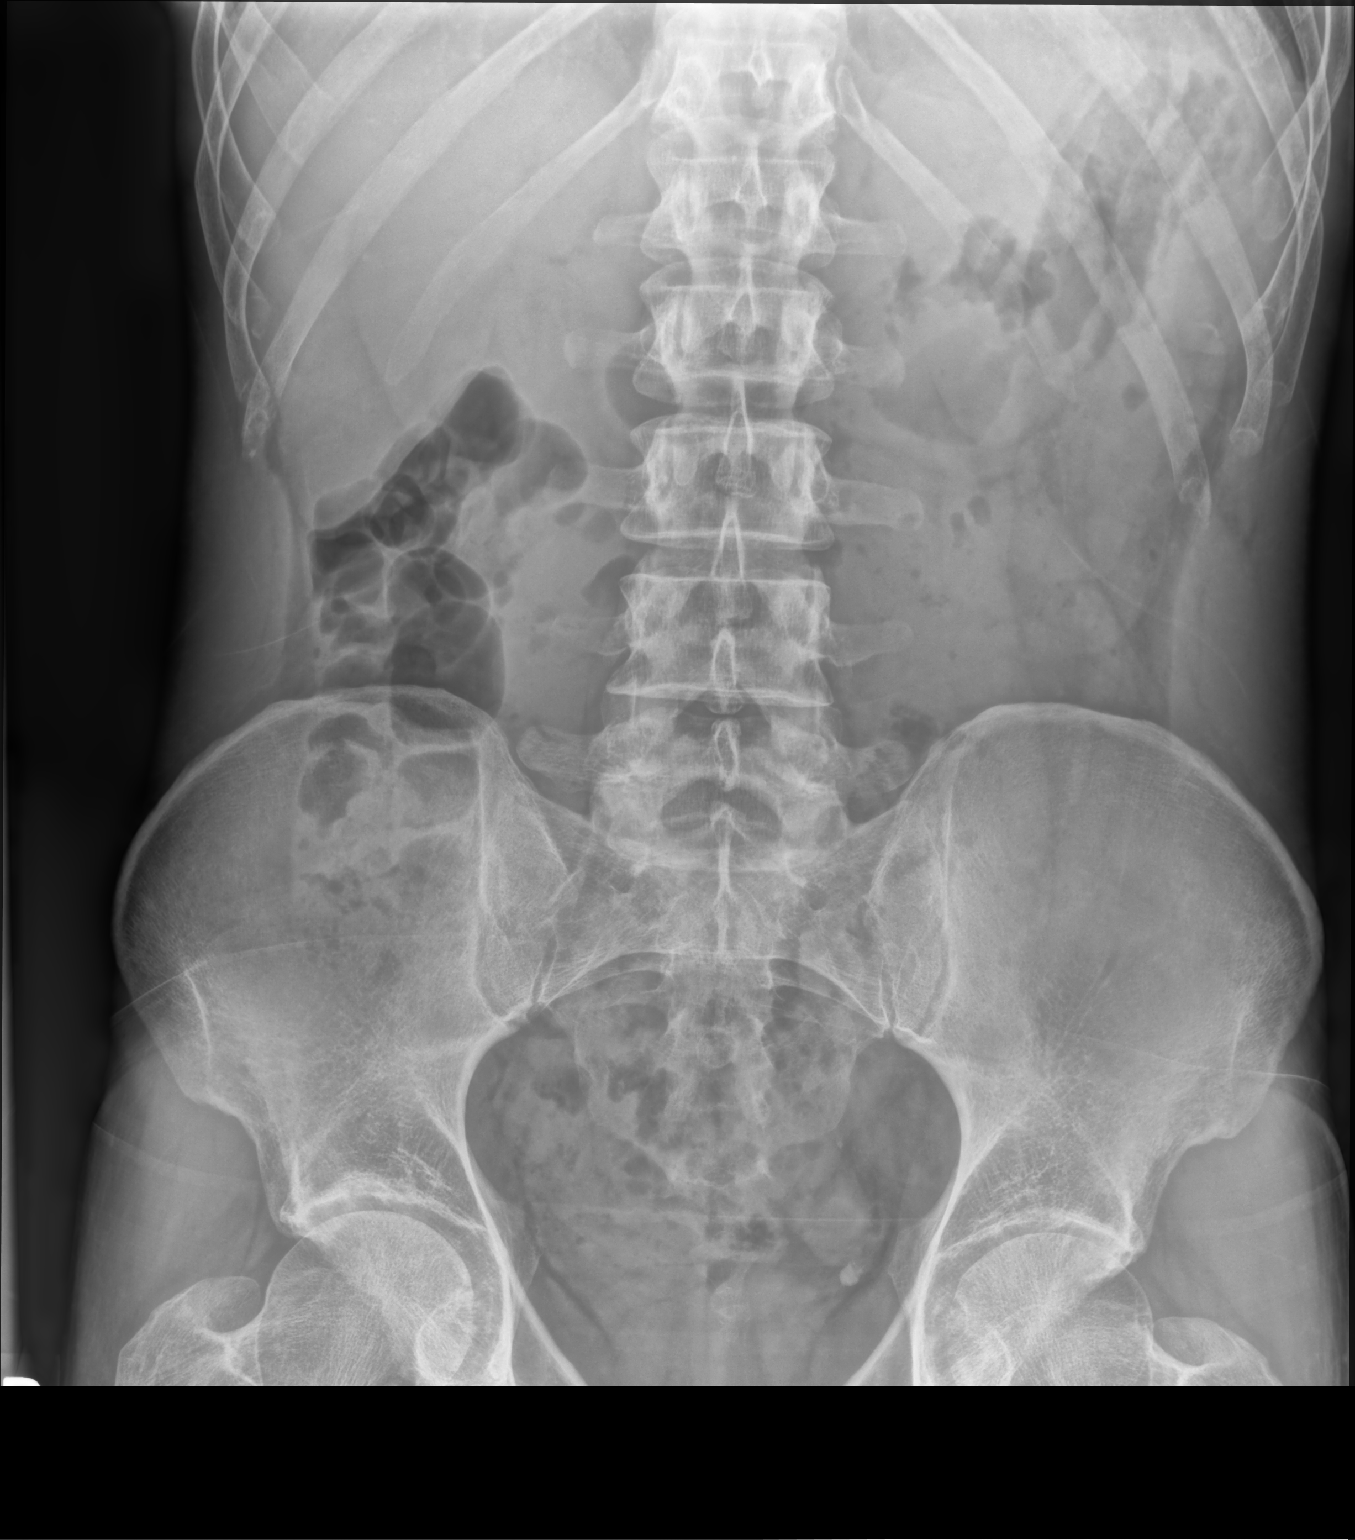

[2 of 2 positions shown; findings below may reference images not displayed]

FINDINGS: Nonobstructive bowel gas pattern. Lung bases are clear. Negative for
free air. Air-fluid level in the stomach. Possible 3 mm right kidney
stone. Calcifications in the left hemipelvis could represent
phleboliths. Bone structures are unremarkable.
IMPRESSION: 1. Normal bowel gas pattern.
2. Question small right kidney stone.

## 2020-08-11 ENCOUNTER — Encounter: Payer: Self-pay | Admitting: General Practice

## 2020-09-09 ENCOUNTER — Other Ambulatory Visit: Payer: BC Managed Care – PPO

## 2020-09-09 DIAGNOSIS — Z20822 Contact with and (suspected) exposure to covid-19: Secondary | ICD-10-CM

## 2020-09-10 LAB — NOVEL CORONAVIRUS, NAA: SARS-CoV-2, NAA: DETECTED — AB

## 2020-09-10 LAB — SARS-COV-2, NAA 2 DAY TAT

## 2020-09-12 ENCOUNTER — Encounter: Payer: Self-pay | Admitting: Physician Assistant

## 2020-09-12 ENCOUNTER — Other Ambulatory Visit: Payer: Self-pay | Admitting: Physician Assistant

## 2020-09-12 DIAGNOSIS — U071 COVID-19: Secondary | ICD-10-CM

## 2020-09-12 DIAGNOSIS — I10 Essential (primary) hypertension: Secondary | ICD-10-CM

## 2020-09-12 NOTE — Progress Notes (Signed)
I connected by phone with Jonathan Mcguire on 09/12/2020 at 1:45 PM to discuss the potential use of a new treatment for mild to moderate COVID-19 viral infection in non-hospitalized patients.  This patient is a 49 y.o. male that meets the FDA criteria for Emergency Use Authorization of COVID monoclonal antibody casirivimab/imdevimab or bamlanivimab/eteseviamb.  Has a (+) direct SARS-CoV-2 viral test result  Has mild or moderate COVID-19   Is NOT hospitalized due to COVID-19  Is within 10 days of symptom onset  Has at least one of the high risk factor(s) for progression to severe COVID-19 and/or hospitalization as defined in EUA.  Specific high risk criteria : Cardiovascular disease or hypertension   I have spoken and communicated the following to the patient or parent/caregiver regarding COVID monoclonal antibody treatment:  1. FDA has authorized the emergency use for the treatment of mild to moderate COVID-19 in adults and pediatric patients with positive results of direct SARS-CoV-2 viral testing who are 47 years of age and older weighing at least 40 kg, and who are at high risk for progressing to severe COVID-19 and/or hospitalization.  2. The significant known and potential risks and benefits of COVID monoclonal antibody, and the extent to which such potential risks and benefits are unknown.  3. Information on available alternative treatments and the risks and benefits of those alternatives, including clinical trials.  4. Patients treated with COVID monoclonal antibody should continue to self-isolate and use infection control measures (e.g., wear mask, isolate, social distance, avoid sharing personal items, clean and disinfect "high touch" surfaces, and frequent handwashing) according to CDC guidelines.   5. The patient or parent/caregiver has the option to accept or refuse COVID monoclonal antibody treatment.  After reviewing this information with the patient, the patient has agreed  to receive one of the available covid 19 monoclonal antibodies and will be provided an appropriate fact sheet prior to infusion. Jonathan Mcguire Jonathan Mcguire, [place] 09/12/2020 1:45 PM

## 2020-09-13 ENCOUNTER — Ambulatory Visit (HOSPITAL_COMMUNITY)
Admission: RE | Admit: 2020-09-13 | Discharge: 2020-09-13 | Disposition: A | Discharge status: Home / Self Care | Payer: BC Managed Care – PPO | Source: Ambulatory Visit | Attending: Pulmonary Disease | Admitting: Pulmonary Disease

## 2020-09-13 DIAGNOSIS — U071 COVID-19: Secondary | ICD-10-CM | POA: Insufficient documentation

## 2020-09-13 DIAGNOSIS — I10 Essential (primary) hypertension: Secondary | ICD-10-CM | POA: Insufficient documentation

## 2020-09-13 MED ORDER — FAMOTIDINE IN NACL 20-0.9 MG/50ML-% IV SOLN: 20.0000 mg | Freq: Once | INTRAVENOUS | Status: DC | PRN

## 2020-09-13 MED ORDER — EPINEPHRINE 0.3 MG/0.3ML IJ SOAJ: 0.3000 mg | Freq: Once | INTRAMUSCULAR | Status: DC | PRN

## 2020-09-13 MED ORDER — DIPHENHYDRAMINE HCL 50 MG/ML IJ SOLN: 50.0000 mg | Freq: Once | INTRAMUSCULAR | Status: DC | PRN

## 2020-09-13 MED ORDER — ALBUTEROL SULFATE HFA 108 (90 BASE) MCG/ACT IN AERS: 2.0000 | INHALATION_SPRAY | Freq: Once | RESPIRATORY_TRACT | Status: DC | PRN

## 2020-09-13 MED ORDER — METHYLPREDNISOLONE SODIUM SUCC 125 MG IJ SOLR: 125.0000 mg | Freq: Once | INTRAMUSCULAR | Status: DC | PRN

## 2020-09-13 MED ORDER — SODIUM CHLORIDE 0.9 % IV SOLN
1200.0000 mg | Freq: Once | INTRAVENOUS | Status: AC
  Administered 2020-09-13: 1200 mg via INTRAVENOUS

## 2020-09-13 MED ORDER — SODIUM CHLORIDE 0.9 % IV SOLN: INTRAVENOUS | Status: DC | PRN

## 2020-09-13 NOTE — Progress Notes (Signed)
  Diagnosis: COVID-19  Physician: Dr. Wright  Procedure: [facility] Infusion [facility] Med: casirivimab\imdevimab infusion - Provided patient with casirivimab\imdevimab fact sheet for patients, parents and caregivers prior to infusion.  Complications: No immediate complications noted.  Discharge: Discharged home   Jonathan Mcguire Jonathan Mcguire 09/13/2020  

## 2020-09-13 NOTE — Discharge Instructions (Signed)

## 2021-01-26 ENCOUNTER — Other Ambulatory Visit: Payer: Self-pay | Admitting: Physician Assistant

## 2021-01-26 ENCOUNTER — Encounter: Payer: Self-pay | Admitting: Physician Assistant

## 2021-01-26 MED ORDER — LORAZEPAM 0.5 MG PO TABS: 0.5000 mg | ORAL_TABLET | Freq: Every evening | ORAL | 0 refills | Status: DC | PRN

## 2021-01-26 NOTE — Telephone Encounter (Signed)
Pt requesting refill for Lorazepam. He is due for a physical.

## 2021-04-13 ENCOUNTER — Other Ambulatory Visit: Payer: Self-pay

## 2021-04-13 ENCOUNTER — Ambulatory Visit (INDEPENDENT_AMBULATORY_CARE_PROVIDER_SITE_OTHER): Payer: BC Managed Care – PPO | Admitting: Psychiatry

## 2021-04-13 ENCOUNTER — Encounter: Payer: Self-pay | Admitting: Psychiatry

## 2021-04-13 VITALS — BP 176/109 | HR 83 | Ht 68.0 in | Wt 134.0 lb

## 2021-04-13 DIAGNOSIS — F411 Generalized anxiety disorder: Secondary | ICD-10-CM

## 2021-04-13 MED ORDER — LORAZEPAM 0.5 MG PO TABS: 0.5000 mg | ORAL_TABLET | Freq: Three times a day (TID) | ORAL | 1 refills | Status: DC

## 2021-04-13 NOTE — Progress Notes (Signed)
[facility] MD/PA/NP Initial Note  04/13/2021 7:04 PM Jonathan Mcguire  MRN:  [id number]  Chief Complaint:  Chief Complaint    Establish Care; Anxiety; Fatigue      HPI: PCP dx GAD.  Not convinced.   Appt in a couple of weeks with [facility].  Just over 4 years ago sx started with palpitations.  Went to ER and looked ok on workup including cardiac workup.  Multiple EKG's, labs.  Card said white coat syndrome.  Dx stress.  None of it's gone away.     Episodes every few months with lightheadedness, palpitations.  Card rec beta blocker with unreliable results. 2 year ago started GI issues with stomach ache and bloated.  Abd X-ray with gas bubble.  Miralax helped prn. Now when sx start it involves the combo of above.  GI sx ongoing despite regular BM and feels full and bloated.  Hard to eat bc feels full and that leads to more fatigue. Anxious visiting doctor.  Doesn't notice correlation with stress for the sx.   Before all this 8 hours but light sleeper and feel well-rested.  Now sleep all over the place.  A lot of occ of trouble going to sleep.  Usually awaken with trouble going back to sleep and feels exhausted in the morning.  If gets good sleep rarely then feels much better. Weight without much change but losing some weight since college.   Struggle to put on weight. No depression.  Good interest and enjoyment.  Enjoys golf, reading.  Usually active and enjoys exercise.  Not weight lifting bc of these sx.   No other avoidance.   Feels effect of alcohol more than in the past and doesn't drink. 1 cup coffee AM and no other coffee.  Hx Lexapro 5 at least 4 weeks, 5 mg with SE lightheaded Buspirone 5 TID SE Hydroxyzine. Zoloft 25 lightheaded Lorazepam 0.5 works without SE.  Helps palpitations and calms me down.  Limited use.  Magnesium for few weeks Ashwaganda NR  Visit Diagnosis:    ICD-10-CM   1. Generalized anxiety disorder  F41.1 LORazepam (ATIVAN) 0.5 MG tablet     Past Psychiatric History: No family history of anxiety except sister similar with chronic Lyme dz  Past Medical History:  Past Medical History:  Diagnosis Date  . Essential hypertension   . MIld Tricuspid regurgitation    a. 02/2017 Echo: EF 60-65%, no rwma, nl LA/RA size, nl RV fxn, mild TR.  Marland Kitchen Palpitations   . Raynaud disease     Past Surgical History:  Procedure Laterality Date  . FRACTURE SURGERY Left 1987   Left leg, compound fx  . TONSILLECTOMY Bilateral 1980  . TRANSTHORACIC ECHOCARDIOGRAM  12/2017   NORMAL Study.  EF 60-65% with no regional wall motion normalities and no valvular lesions.  (Notably no evidence of Ebstein's anomaly.    Family Psychiatric History: As noted  Family History:   Family History  Problem Relation Age of Onset  . Hypertension Mother   . Cancer Father        ?neck  . Cancer Maternal Grandmother        pancreatic  . Heart disease Maternal Grandfather   . Stroke Maternal Grandfather        Tobacco abuse  . Cancer - Other Paternal Grandmother        Kidney  . Colon cancer Neg Hx     Social History: Wife Research scientist (physical sciences) Social History   Socioeconomic History  . Marital  status: Married    Spouse name: Not on file  . Number of children: Not on file  . Years of education: Not on file  . Highest education level: Not on file  Occupational History  . Not on file  Tobacco Use  . Smoking status: Never Smoker  . Smokeless tobacco: Never Used  Substance and Sexual Activity  . Alcohol use: Yes    Alcohol/week: 3.0 - 5.0 standard drinks    Types: 2 - 3 Cans of beer, 1 - 2 Glasses of wine per week  . Drug use: No  . Sexual activity: Yes  Other Topics Concern  . Not on file  Social History Narrative   Lives in [place] with wife and three children.  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   Social Determinants of Health   Financial Resource Strain: Not on file  Food Insecurity: Not on file   Transportation Needs: Not on file  Physical Activity: Not on file  Stress: Not on file  Social Connections: Not on file    Allergies: No Known Allergies  Metabolic Disorder Labs: Lab Results  Component Value Date   HGBA1C 5.5 12/24/2019   No results found for: PROLACTIN Lab Results  Component Value Date   CHOL 211 (H) 12/23/2019   TRIG 78.0 12/23/2019   HDL 79.70 12/23/2019   CHOLHDL 3 12/23/2019   VLDL 15.6 12/23/2019   LDLCALC 115 (H) 12/23/2019   LDLCALC 103 (H) 02/05/2017   Lab Results  Component Value Date   TSH 2.76 10/01/2018   TSH 2.900 12/05/2017    Therapeutic Level Labs: No results found for: LITHIUM No results found for: VALPROATE No components found for:  CBMZ  Current Medications: Current Outpatient Medications  Medication Sig Dispense Refill  . LORazepam (ATIVAN) 0.5 MG tablet Take 1 tablet (0.5 mg total) by mouth in the morning, at noon, and at bedtime. 90 tablet 1  . metoprolol succinate (TOPROL-XL) 25 MG 24 hr tablet Take 0.5 tablets (12.5 mg total) by mouth daily. (Patient not taking: Reported on 04/13/2021) 45 tablet 3   No current facility-administered medications for this visit.    Medication Side Effects: none  Orders placed this visit:  No orders of the defined types were placed in this encounter.   Psychiatric Specialty Exam:  Review of Systems  Constitutional: Positive for appetite change and unexpected weight change.  HENT: Negative for congestion, hearing loss, sinus pain, sore throat, tinnitus and trouble swallowing.   Eyes: Negative for visual disturbance.  Respiratory: Negative for apnea, cough and shortness of breath.   Cardiovascular: Positive for palpitations. Negative for chest pain.  Gastrointestinal: Positive for abdominal pain and constipation. Negative for abdominal distention, nausea and vomiting.  Endocrine: Negative for polyuria.  Genitourinary: Negative for difficulty urinating, enuresis, flank pain and urgency.   Musculoskeletal: Negative for arthralgias, gait problem and neck pain.  Skin: Negative for rash.  Neurological: Negative for dizziness, speech difficulty, weakness and headaches.    Blood pressure (!) 176/109, pulse 83, height 5\' 8"  (1.727 m), weight 134 lb (60.8 kg).Body mass index is 20.37 kg/m.  General Appearance: Neat  Eye Contact:  Good  Speech:  Normal Rate  Volume:  Normal  Mood:  Anxious  Affect:  Appropriate and Full Range  Thought Process:  Coherent, Goal Directed and Descriptions of Associations: Intact  Orientation:  Full (Time, Place, and Person)  Thought Content: Logical and Hallucinations: None   Suicidal Thoughts:  No  Homicidal Thoughts:  No  Memory:  WNL  Judgement:  Good  Insight:  Good  Psychomotor Activity:  Normal  Concentration:  Concentration: Good  Recall:  Good  Fund of Knowledge: Good  Language: Good  Assets:  Communication Skills Desire for Improvement Financial Resources/Insurance Housing Intimacy Leisure Time Physical Health Resilience Social Support Talents/Skills Transportation Vocational/Educational  ADL's:  Intact  Cognition: WNL  Prognosis:  Good   Screenings:  GAD-7   Flowsheet Row Office Visit from 06/02/2020 in [facility] from 01/27/2020 in [facility] from 12/23/2019 in [facility] from 10/23/2018 in [facility]  Total GAD-7 Score 6 5 11 8     PHQ2-9   Flowsheet Row Office Visit from 06/02/2020 in [facility] from 01/28/2018 in [facility] from 01/22/2017 in [facility]  PHQ-2 Total Score 0 0 0      Receiving Psychotherapy: No   Treatment Plan/Recommendations: Greater than 50% of 60 min  face to face time with patient was spent on counseling and coordination of care. We discussed differential dx per  his request of possible organic causes of anxiety including hyperthyroidism including auto immune causes and pheochromocytoma etc as well as panic and GAD.    Disc at length his interest in identifying an organic cause for the anxiety_type sx>  Disc most likely this is gad>  EXTENSIVE discusssion of tx algorithm of GAD with rec of ssri but he"s poorly tolerated them>>  aLTERnatives of Bz, off label gabapentin or BP meds  Consider Genesight testing DT med sensitivity.  Option mirtazapine.  He agrees to trial of BZ to quickly assess whether anxity meds will help his sx.  Disc blood levels of Bz and half-life. We discussed the short-term risks associated with benzodiazepines including sedation and increased fall risk among others.  Discussed long-term side effect risk including dependence, potential withdrawal symptoms, and the potential eventual dose-related risk of dementia.  But recent studies from 2020 dispute this association between benzodiazepines and dementia risk. Newer studies in 2020 do not support an association with dementia. He agrees to trial of lorazepam 0.5 mg TID.   FU 6 weeks  2021, MD

## 2021-06-22 ENCOUNTER — Other Ambulatory Visit: Payer: Self-pay

## 2021-06-22 ENCOUNTER — Ambulatory Visit (INDEPENDENT_AMBULATORY_CARE_PROVIDER_SITE_OTHER): Payer: BC Managed Care – PPO | Admitting: Psychiatry

## 2021-06-22 ENCOUNTER — Encounter: Payer: Self-pay | Admitting: Psychiatry

## 2021-06-22 DIAGNOSIS — F5105 Insomnia due to other mental disorder: Secondary | ICD-10-CM

## 2021-06-22 DIAGNOSIS — F411 Generalized anxiety disorder: Secondary | ICD-10-CM

## 2021-06-22 MED ORDER — LORAZEPAM 0.5 MG PO TABS: 0.5000 mg | ORAL_TABLET | Freq: Three times a day (TID) | ORAL | 0 refills | Status: DC

## 2021-06-22 NOTE — Progress Notes (Signed)
Jonathan Mcguire [id number] 03/12/1971 50 y.o.  Subjective:   Patient ID:  Jonathan Mcguire is a 50 y.o. (DOB 04-20-1971) male.  Chief Complaint:  Chief Complaint  Patient presents with   Follow-up   Stress     HPI Jonathan Mcguire presents to the office today for follow-up of anxiety.  Has used lorazepam 0.25 mg BID and 0.5 mg HS. SE sometimes a little tired in the afternoon.  3 main sx lightheaded, GI, palpitations.  All are better except some residual GI.  A little tired.  Awesome that he sleeps well and better than ever.  Doesn't feel as well rested as he'd like.  But no worse.   Wonders about whether this is really anxiety.  He does not consciously feel stressed or anxious but has had symptoms somatically that are consistent with anxiety.  The somatic symptoms have had a negative medical work-up.  He wonders if some medical underlying problem may have been mist because his symptoms had a fairly abrupt onset 4 years ago.  He wonders about how long to take the medication.  Still may seek Integrative therapy.    Past Psychiatric Medication Trials: Hx Lexapro 5 at least 4 weeks, 5 mg with SE lightheaded Buspirone 5 TID SE Hydroxyzine. Zoloft 25 lightheaded Lorazepam 0.5 works without SE.  Helps palpitations and calms me down.  Limited use. Magnesium for few weeks Ashwaganda NR  GAD-7    Flowsheet Row Office Visit from 06/02/2020 in [facility] from 01/27/2020 in [facility] from 12/23/2019 in [facility] from 10/23/2018 in [facility]  Total GAD-7 Score 6 5 11 8       PHQ2-9    Flowsheet Row Office Visit from 06/02/2020 in [facility] Visit from 01/28/2018 in [facility] from 01/22/2017 in [facility]  PHQ-2 Total Score 0 0 0        Review of Systems:   Review of Systems  Neurological:  Negative for tremors and weakness.   Medications: I have reviewed the patient's current medications.  Current Outpatient Medications  Medication Sig Dispense Refill   LORazepam (ATIVAN) 0.5 MG tablet Take 1 tablet (0.5 mg total) by mouth in the morning, at noon, and at bedtime. 270 tablet 0   metoprolol succinate (TOPROL-XL) 25 MG 24 hr tablet Take 0.5 tablets (12.5 mg total) by mouth daily. (Patient not taking: Reported on 04/13/2021) 45 tablet 3   No current facility-administered medications for this visit.    Medication Side Effects: Other: perhaps slight tiredness  Allergies: No Known Allergies  Past Medical History:  Diagnosis Date   Essential hypertension    MIld Tricuspid regurgitation    a. 02/2017 Echo: EF 60-65%, no rwma, nl LA/RA size, nl RV fxn, mild TR.   Palpitations    Raynaud disease     Past Medical History, Surgical history, Social history, and Family history were reviewed and updated as appropriate.   Please see review of systems for further details on the patient's review from today.   Objective:   Physical Exam:  There were no vitals taken for this visit.  Physical Exam Constitutional:      General: He is not in acute distress. Musculoskeletal:        General: No deformity.  Neurological:     Mental Status: He is alert and oriented to person, place, and time.  Coordination: Coordination normal.  Psychiatric:        Attention and Perception: Attention and perception normal. He does not perceive auditory or visual hallucinations.        Mood and Affect: Mood is anxious. Mood is not depressed. Affect is not labile, blunt, angry or inappropriate.        Speech: Speech normal.        Behavior: Behavior normal.        Thought Content: Thought content normal. Thought content is not paranoid or delusional. Thought content does not include homicidal or suicidal ideation. Thought content does not include homicidal or  suicidal plan.        Cognition and Memory: Cognition and memory normal.        Judgment: Judgment normal.     Comments: Insight intact    Lab Review:     Component Value Date/Time   NA 138 06/02/2020 1142   NA 141 12/13/2017 1043   K 4.4 06/02/2020 1142   CL 103 06/02/2020 1142   CO2 28 06/02/2020 1142   GLUCOSE 98 06/02/2020 1142   BUN 14 06/02/2020 1142   BUN 14 12/13/2017 1043   CREATININE 1.00 06/02/2020 1142   CREATININE 1.13 02/16/2017 1028   CALCIUM 10.2 06/02/2020 1142   PROT 7.8 06/02/2020 1142   ALBUMIN 5.1 06/02/2020 1142   AST 21 06/02/2020 1142   ALT 20 06/02/2020 1142   ALKPHOS 41 06/02/2020 1142   BILITOT 0.9 06/02/2020 1142   GFRNONAA 77 12/13/2017 1043   GFRAA 89 12/13/2017 1043       Component Value Date/Time   WBC 5.9 06/02/2020 1142   RBC 5.46 06/02/2020 1142   HGB 16.4 06/02/2020 1142   HCT 47.4 06/02/2020 1142   PLT 211.0 06/02/2020 1142   MCV 86.7 06/02/2020 1142   MCH 29.1 01/25/2017 1145   MCHC 34.6 06/02/2020 1142   RDW 13.0 06/02/2020 1142   LYMPHSABS 1.7 06/02/2020 1142   MONOABS 0.5 06/02/2020 1142   EOSABS 0.1 06/02/2020 1142   BASOSABS 0.1 06/02/2020 1142    No results found for: POCLITH, LITHIUM   No results found for: PHENYTOIN, PHENOBARB, VALPROATE, CBMZ   .res Assessment: Plan:    Jonathan Mcguire was seen today for follow-up and stress.  Diagnoses and all orders for this visit:  Generalized anxiety disorder -     LORazepam (ATIVAN) 0.5 MG tablet; Take 1 tablet (0.5 mg total) by mouth in the morning, at noon, and at bedtime.  Insomnia due to mental condition  Greater than 50% of 30-minute face to face time with patient was spent on counseling and coordination of care. We discussed whether generalized anxiety disorder is an accurate diagnosis or not.  He does not consciously feel anxious as noted.  However the somatic symptoms that are consistent with anxiety improved with the lorazepam.  He is tolerating the lorazepam very  well at 0.25 mg twice daily and 0.5 mg nightly.  He he is sleeping better then in a long time.  He still wonders whether there might be some underlying medical problem that has been mist that is causing his symptoms.  He may pursue an integrative alternative medicine evaluation because he wonders if perhaps he has chronic Lyme disease.  We discussed the difference between traditional [place] perspectives and alternative medicine perspectives or integrative medicine.  It was suggested that he continue the medication at the current dose for another month or 2 before he considers reducing the dose and trying to  wean.  We discussed weaning at a slow pace of 0.25 mg every 2 to 4 weeks.  We discussed acute withdrawal as well as the need for the body to adjust slowly as he reduce his medication. We discussed the short-term risks associated with benzodiazepines including sedation and increased fall risk among others.  Discussed long-term side effect risk including dependence, potential withdrawal symptoms, and the potential eventual dose-related risk of dementia.  But recent studies from 2020 dispute this association between benzodiazepines and dementia risk. Newer studies in 2020 do not support an association with dementia. If he does fine after the taper he does not really need to return.  If his symptoms recur it is suggested that he let us know and resume the effective treatment.  Follow-up as needed  Jonathan Staggers MD, DFAPA   Please see After Visit Summary for patient specific instructions.  No future appointments.  No orders of the defined types were placed in this encounter.   -------------------------------

## 2022-01-31 ENCOUNTER — Encounter: Payer: Self-pay | Admitting: Psychiatry

## 2022-01-31 ENCOUNTER — Other Ambulatory Visit: Payer: Self-pay

## 2022-01-31 ENCOUNTER — Ambulatory Visit: Payer: BC Managed Care – PPO | Admitting: Psychiatry

## 2022-01-31 DIAGNOSIS — F411 Generalized anxiety disorder: Secondary | ICD-10-CM

## 2022-01-31 DIAGNOSIS — F5105 Insomnia due to other mental disorder: Secondary | ICD-10-CM | POA: Diagnosis not present

## 2022-01-31 MED ORDER — LORAZEPAM 0.5 MG PO TABS: 0.5000 mg | ORAL_TABLET | Freq: Three times a day (TID) | ORAL | 0 refills | Status: DC

## 2022-01-31 NOTE — Progress Notes (Signed)
Jonathan Mcguire [id number] Feb 21, 1971 51 y.o.  Subjective:   Patient ID:  Jonathan Mcguire is a 51 y.o. (DOB [birth year]) male.  Chief Complaint:  Chief Complaint  Patient presents with   Follow-up   Anxiety     HPI Jonathan Mcguire presents to the office today for follow-up of anxiety.  Has used lorazepam 0.25 mg BID and 0.5 mg HS. SE sometimes a little tired in the afternoon.  3 main sx lightheaded, GI, palpitations.  All are better except some residual GI.  A little tired.  Awesome that he sleeps well and better than ever.  Doesn't feel as well rested as he'd like.  But no worse.   Wonders about whether this is really anxiety.  He does not consciously feel stressed or anxious but has had symptoms somatically that are consistent with anxiety.  The somatic symptoms have had a negative medical work-up.  He wonders if some medical underlying problem may have been mist because his symptoms had a fairly abrupt onset 4 years ago.  He wonders about how long to take the medication.  01/31/22 appt noted: Pending Genesight testing being sent again. Anxiety with somatic sx GI and chest palpitations. July August took lorazepam 0.25 mg BID and 0.5 mg HS with questionable benefit and weaned off daytime doses Sept and October. Struggled with sx and sleep with less than 0.5 mg HS.  Good sleep with it. Still taking 0.5 mg HS and lately more in daytime 0.5 mg AM and HS.  Better with it.  No SE Chronically ambivalent about medication.   Went to [facility] end of October.  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  Took supplements and increased metoprolol 25  to 50 mg daily without clear benefit with for palpitations. GI px are better.   Wife thinks he overreacts with adrenaline. Multiple ER visits and cardiac evals for palpitations negative.  Still wonders.  Still may seek Integrative therapy.    Past Psychiatric Medication Trials: Hx Lexapro 5 at least 4 weeks, 5 mg  with SE lightheaded Buspirone 5 TID SE Hydroxyzine. Zoloft 25 lightheaded Lorazepam 0.5 works without SE.  Helps palpitations and calms me down.  Limited use. Magnesium for few weeks Ashwaganda NR  GAD-7    Flowsheet Row Office Visit from 06/02/2020 in [facility] from 01/27/2020 in [facility] from 12/23/2019 in [facility] from 10/23/2018 in [facility]  Total GAD-7 Score 6 5 11 8       PHQ2-9    Flowsheet Row Office Visit from 06/02/2020 in [facility] Visit from 01/28/2018 in [facility] from 01/22/2017 in [facility]  PHQ-2 Total Score 0 0 0        Review of Systems:  Review of Systems  Cardiovascular:  Positive for palpitations.  Neurological:  Negative for tremors and weakness.   Medications: I have reviewed the patient's current medications.  Current Outpatient Medications  Medication Sig Dispense Refill   LORazepam (ATIVAN) 0.5 MG tablet Take 1 tablet (0.5 mg total) by mouth in the morning, at noon, and at bedtime. 270 tablet 0   metoprolol succinate (TOPROL-XL) 25 MG 24 hr tablet Take 0.5 tablets (12.5 mg total) by mouth daily. (Patient taking differently: Take 50 mg by mouth daily.) 45 tablet 3   No current facility-administered medications for this visit.    Medication Side  Effects: Other: perhaps slight tiredness  Allergies: No Known Allergies  Past Medical History:  Diagnosis Date   Essential hypertension    MIld Tricuspid regurgitation    a. 02/2017 Echo: EF 60-65%, no rwma, nl LA/RA size, nl RV fxn, mild TR.   Palpitations    Raynaud disease     Past Medical History, Surgical history, Social history, and Family history were reviewed and updated as appropriate.   Please see review of systems for further details on the patient's review  from today.   Objective:   Physical Exam:  There were no vitals taken for this visit.  Physical Exam Constitutional:      General: He is not in acute distress. Musculoskeletal:        General: No deformity.  Neurological:     Mental Status: He is alert and oriented to person, place, and time.     Coordination: Coordination normal.  Psychiatric:        Attention and Perception: Attention and perception normal. He does not perceive auditory or visual hallucinations.        Mood and Affect: Mood is anxious. Mood is not depressed. Affect is not labile, blunt, angry or inappropriate.        Speech: Speech normal.        Behavior: Behavior normal.        Thought Content: Thought content normal. Thought content is not paranoid or delusional. Thought content does not include homicidal or suicidal ideation. Thought content does not include suicidal plan.        Cognition and Memory: Cognition and memory normal.        Judgment: Judgment normal.     Comments: Insight intact    Lab Review:     Component Value Date/Time   NA 138 06/02/2020 1142   NA 141 12/13/2017 1043   K 4.4 06/02/2020 1142   CL 103 06/02/2020 1142   CO2 28 06/02/2020 1142   GLUCOSE 98 06/02/2020 1142   BUN 14 06/02/2020 1142   BUN 14 12/13/2017 1043   CREATININE 1.00 06/02/2020 1142   CREATININE 1.13 02/16/2017 1028   CALCIUM 10.2 06/02/2020 1142   PROT 7.8 06/02/2020 1142   ALBUMIN 5.1 06/02/2020 1142   AST 21 06/02/2020 1142   ALT 20 06/02/2020 1142   ALKPHOS 41 06/02/2020 1142   BILITOT 0.9 06/02/2020 1142   GFRNONAA 77 12/13/2017 1043   GFRAA 89 12/13/2017 1043       Component Value Date/Time   WBC 5.9 06/02/2020 1142   RBC 5.46 06/02/2020 1142   HGB 16.4 06/02/2020 1142   HCT 47.4 06/02/2020 1142   PLT 211.0 06/02/2020 1142   MCV 86.7 06/02/2020 1142   MCH 29.1 01/25/2017 1145   MCHC 34.6 06/02/2020 1142   RDW 13.0 06/02/2020 1142   LYMPHSABS 1.7 06/02/2020 1142   MONOABS 0.5 06/02/2020 1142    EOSABS 0.1 06/02/2020 1142   BASOSABS 0.1 06/02/2020 1142    No results found for: POCLITH, LITHIUM   No results found for: PHENYTOIN, PHENOBARB, VALPROATE, CBMZ   .res Assessment: Plan:    Dwright was seen today for follow-up and anxiety.  Diagnoses and all orders for this visit:  Generalized anxiety disorder  Insomnia due to mental condition   Greater than 50% of 30-minute face to face time with patient was spent on counseling and coordination of care. We discussed whether generalized anxiety disorder is an accurate diagnosis or not.  He does not consciously feel anxious  as noted.  However the somatic symptoms that are consistent with anxiety improved with the lorazepam.  He is tolerating the lorazepam very well. at 0.25 mg twice daily and 0.5 mg nightly.  He he is sleeping better then in a long time.  He still wonders whether there might be some underlying medical problem that has been mist that is causing his symptoms.  He may pursue an integrative alternative medicine evaluation because he wonders if perhaps he has chronic Lyme disease.  We discussed the difference between traditional [place] perspectives and alternative medicine perspectives or integrative medicine.  We discussed the short-term risks associated with benzodiazepines including sedation and increased fall risk among others.  Discussed long-term side effect risk including dependence, potential withdrawal symptoms, and the potential eventual dose-related risk of dementia.  But recent studies from 2020 dispute this association between benzodiazepines and dementia risk. Newer studies in 2020 do not support an association with dementia. More somatic anxiety without low dose lorazepam.  Rec he try it it higher at 0.5 mg TID bc no SE. Disc pros and cons of LT use of BZ.  It works.  Disc Genesight testing in detail and what it says and doesn't say.  Follow-up 2 mos  Meredith Staggers MD, DFAPA   Please see After Visit  Summary for patient specific instructions.  No future appointments.  No orders of the defined types were placed in this encounter.    -------------------------------

## 2022-02-27 ENCOUNTER — Ambulatory Visit (INDEPENDENT_AMBULATORY_CARE_PROVIDER_SITE_OTHER): Payer: BC Managed Care – PPO | Admitting: Psychiatry

## 2022-02-27 ENCOUNTER — Encounter: Payer: Self-pay | Admitting: Psychiatry

## 2022-02-27 ENCOUNTER — Other Ambulatory Visit: Payer: Self-pay

## 2022-02-27 DIAGNOSIS — F411 Generalized anxiety disorder: Secondary | ICD-10-CM | POA: Diagnosis not present

## 2022-02-27 DIAGNOSIS — F5105 Insomnia due to other mental disorder: Secondary | ICD-10-CM | POA: Diagnosis not present

## 2022-02-27 MED ORDER — LORAZEPAM ER 1.5 MG PO CS24: 1.0000 | EXTENDED_RELEASE_CAPSULE | Freq: Every day | ORAL | 5 refills | Status: DC

## 2022-02-27 NOTE — Progress Notes (Signed)
Jonathan Mcguire ?[id number] ?1971-03-14 ?51 y.o. ? ?Subjective:  ? ?Patient ID:  Jonathan Mcguire is a 51 y.o. (DOB 1971-03-14) male. ? ?Chief Complaint:  ?Chief Complaint  ?Patient presents with  ? Follow-up  ? Anxiety  ? ? ? ?HPI ?Jonathan Mcguire presents to the office today for follow-up of anxiety. ? ?Has used lorazepam 0.25 mg BID and 0.5 mg HS. ?SE sometimes a little tired in the afternoon. ? ?3 main sx lightheaded, GI, palpitations.  All are better except some residual GI.  A little tired.  Awesome that he sleeps well and better than ever.  Doesn't feel as well rested as he'd like.  But no worse.   ?Wonders about whether this is really anxiety.  He does not consciously feel stressed or anxious but has had symptoms somatically that are consistent with anxiety.  The somatic symptoms have had a negative medical work-up.  He wonders if some medical underlying problem may have been mist because his symptoms had a fairly abrupt onset 4 years ago.  He wonders about how long to take the medication. ? ?01/31/22 appt noted: ?Pending Genesight testing being sent again. ?Anxiety with somatic sx GI and chest palpitations. ?July August took lorazepam 0.25 mg BID and 0.5 mg HS with questionable benefit and weaned off daytime doses Sept and October. ?Struggled with sx and sleep with less than 0.5 mg HS.  Good sleep with it. ?Still taking 0.5 mg HS and lately more in daytime 0.5 mg AM and HS.  Better with it.  No SE ?Chronically ambivalent about medication.   ?[facility] end of October.  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  Took supplements and increased metoprolol 25  to 50 mg daily without clear benefit with for palpitations. ?GI px are better.   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 ?Multiple ER visits and cardiac evals for palpitations negative.  Still wonders. ?Plan: More somatic anxiety without low dose lorazepam.  Rec he try it it higher at 0.5 mg TID bc no SE. ? ?02/27/2022  appointment with the following noted: ?Lorazepam feels great.  Zero sx re: chest issues. ?No SE.   ?Feels great to have gone a month wihtot palpitations, chest pain, worry over cardiac events.  ?Patient reports stable mood and denies depressed or irritable moods.  Patient denies any recent difficulty with anxiety.  Patient denies difficulty with sleep initiation or maintenance. Denies appetite disturbance.  Patient reports that energy and motivation have been good.  Patient denies any difficulty with concentration.  Patient denies any suicidal ideation. ?Only thing with zero SE. EXCEPTWHEN FORGETS MIDDAY DOSE THEN GETS ANXIOUS AND THIS HAPPENS ON OCCASSIONS. ?Has low T and wonders about it. ?UP and down with GI but not much as before.  Original complaints head, chest, and GI. ? ?Still may seek Integrative therapy.   ? ?Past Psychiatric Medication Trials: ?Hx Lexapro 5 at least 4 weeks, 5 mg with SE lightheaded ?Buspirone 5 TID SE ?Hydroxyzine. ?Zoloft 25 lightheaded ?Lorazepam 0.5 works without SE.  Helps palpitations and calms me down.  Limited use. ?Magnesium for few weeks ?Ashwaganda NR ? ?GAD-7   ? ?Flowsheet Row [facility] Visit from [facility]/23[facility]2019 [facility] Visit from 10/23/2018 in [facility]  ?Total GAD-7 Score 6 5 11 8   ? ?  ? ?PHQ2-9   ? ?Flowsheet Row  [facility]2020 [facility] Visit from 01/28/2018 in [facility] from 01/22/2017 in [facility]  ?PHQ-2 Total Score 0 0 0  ? ?  ?  ? ?Review of Systems:  ?Review of Systems  ?Cardiovascular:  Negative for chest pain and palpitations.  ?Neurological:  Negative for tremors and weakness.  ? ?Medications: I have reviewed the patient's current medications. ? ?Current Outpatient  Medications  ?Medication Sig Dispense Refill  ? LORazepam (ATIVAN) 0.5 MG tablet Take 1 tablet (0.5 mg total) by mouth in the morning, at noon, and at bedtime. 270 tablet 0  ? LORazepam ER 1.5 MG CS24 Take 1 tablet by mouth daily. 30 capsule 5  ? metoprolol succinate (TOPROL-XL) 25 MG 24 hr tablet Take 0.5 tablets (12.5 mg total) by mouth daily. (Patient taking differently: Take 50 mg by mouth daily.) 45 tablet 3  ? ?No current facility-administered medications for this visit.  ? ? ?Medication Side Effects: Other: perhaps slight tiredness ? ?Allergies: No Known Allergies ? ?Past Medical History:  ?Diagnosis Date  ? Essential hypertension   ? MIld Tricuspid regurgitation   ? a. 02/2017 Echo: EF 60-65%, no rwma, nl LA/RA size, nl RV fxn, mild TR.  ? Palpitations   ? Raynaud disease   ? ? ?Past Medical History, Surgical history, Social history, and Family history were reviewed and updated as appropriate.  ? ?Please see review of systems for further details on the patient's review from today.  ? ?Objective:  ? ?Physical Exam:  ?There were no vitals taken for this visit. ? ?Physical Exam ?Constitutional:   ?   General: He is not in acute distress. ?Musculoskeletal:     ?   General: No deformity.  ?Neurological:  ?   Mental Status: He is alert and oriented to person, place, and time.  ?   Coordination: Coordination normal.  ?Psychiatric:     ?   Attention and Perception: Attention and perception normal. He does not perceive auditory or visual hallucinations.     ?   Mood and Affect: Mood is not anxious or depressed. Affect is not labile, blunt, angry or inappropriate.     ?   Speech: Speech normal.     ?   Behavior: Behavior normal.     ?   Thought Content: Thought content normal. Thought content is not paranoid or delusional. Thought content does not include homicidal or suicidal ideation. Thought content does not include suicidal plan.     ?   Cognition and Memory: Cognition and memory normal.     ?   Judgment: Judgment  normal.  ?   Comments: Insight intact ?Anxiety resolved.  ? ? ?Lab Review:  ?   ?Component Value Date/Time  ? NA 138 06/02/2020 1142  ? NA 141 12/13/2017 1043  ? K 4.4 06/02/2020 1142  ? CL 103 06/02/2020 1142  ? CO2 28 06/02/2020 1142  ? GLUCOSE 98 06/02/2020 1142  ? BUN 14 06/02/2020 1142  ? BUN 14 12/13/2017 1043  ? CREATININE 1.00 06/02/2020 1142  ? CREATININE 1.13 02/16/2017 1028  ? CALCIUM 10.2 06/02/2020 1142  ? PROT 7.8 06/02/2020 1142  ? ALBUMIN 5.1 06/02/2020 1142  ? AST 21 06/02/2020 1142  ? ALT 20 06/02/2020 1142  ? ALKPHOS 41 06/02/2020 1142  ? BILITOT 0.9 06/02/2020 1142  ? GFRNONAA 77 12/13/2017 1043  ? GFRAA 89 12/13/2017 1043  ? ? ?   ?Component Value  Date/Time  ? WBC 5.9 06/02/2020 1142  ? RBC 5.46 06/02/2020 1142  ? HGB 16.4 06/02/2020 1142  ? HCT 47.4 06/02/2020 1142  ? PLT 211.0 06/02/2020 1142  ? MCV 86.7 06/02/2020 1142  ? MCH 29.1 01/25/2017 1145  ? MCHC 34.6 06/02/2020 1142  ? RDW 13.0 06/02/2020 1142  ? LYMPHSABS 1.7 06/02/2020 1142  ? MONOABS 0.5 06/02/2020 1142  ? EOSABS 0.1 06/02/2020 1142  ? BASOSABS 0.1 06/02/2020 1142  ? ? ?No results found for: POCLITH, LITHIUM  ? ?No results found for: PHENYTOIN, PHENOBARB, VALPROATE, CBMZ  ? ?.res ?Assessment: Plan:   ? ?Coen was seen today for follow-up and anxiety. ? ?Diagnoses and all orders for this visit: ? ?Generalized anxiety disorder ?-     LORazepam ER 1.5 MG CS24; Take 1 tablet by mouth daily. ? ?Insomnia due to mental condition ? ? Greater than 50% of 30-minute face to face time with patient was spent on counseling and coordination of care. We discussed whether generalized anxiety disorder is an accurate diagnosis or not.  He does not consciously feel anxious as noted.  However the somatic symptoms that are consistent with anxiety improved with the lorazepam.  He is tolerating the lorazepam very well. at 0.25 mg twice daily and 0.5 mg nightly.  He he is sleeping better then in a long time.  He still wonders whether there might be  some underlying medical problem that has been mist that is causing his symptoms.  He may pursue an integrative alternative medicine evaluation because he wonders if perhaps he has chronic Lyme disease.

## 2022-04-03 ENCOUNTER — Ambulatory Visit: Payer: BC Managed Care – PPO | Admitting: Psychiatry

## 2022-06-14 ENCOUNTER — Encounter: Payer: Self-pay | Admitting: Physician Assistant

## 2022-06-14 ENCOUNTER — Ambulatory Visit (INDEPENDENT_AMBULATORY_CARE_PROVIDER_SITE_OTHER): Payer: BC Managed Care – PPO | Admitting: Physician Assistant

## 2022-06-14 VITALS — BP 150/96 | HR 58 | Temp 98.4°F | Ht 68.0 in | Wt 134.2 lb

## 2022-06-14 DIAGNOSIS — I10 Essential (primary) hypertension: Secondary | ICD-10-CM

## 2022-06-14 DIAGNOSIS — Z136 Encounter for screening for cardiovascular disorders: Secondary | ICD-10-CM

## 2022-06-14 DIAGNOSIS — Z23 Encounter for immunization: Secondary | ICD-10-CM | POA: Diagnosis not present

## 2022-06-14 DIAGNOSIS — Z1211 Encounter for screening for malignant neoplasm of colon: Secondary | ICD-10-CM

## 2022-06-14 DIAGNOSIS — Z0000 Encounter for general adult medical examination without abnormal findings: Secondary | ICD-10-CM | POA: Diagnosis not present

## 2022-06-14 DIAGNOSIS — F419 Anxiety disorder, unspecified: Secondary | ICD-10-CM

## 2022-06-14 DIAGNOSIS — Z13220 Encounter for screening for lipoid disorders: Secondary | ICD-10-CM | POA: Diagnosis not present

## 2022-06-14 DIAGNOSIS — Z1159 Encounter for screening for other viral diseases: Secondary | ICD-10-CM

## 2022-06-14 LAB — CBC WITH DIFFERENTIAL/PLATELET
Basophils Absolute: 0 10*3/uL (ref 0.0–0.1)
Basophils Relative: 0.8 % (ref 0.0–3.0)
Eosinophils Absolute: 0.1 10*3/uL (ref 0.0–0.7)
Eosinophils Relative: 2.3 % (ref 0.0–5.0)
HCT: 47.1 % (ref 39.0–52.0)
Hemoglobin: 15.9 g/dL (ref 13.0–17.0)
Lymphocytes Relative: 33.7 % (ref 12.0–46.0)
Lymphs Abs: 2 10*3/uL (ref 0.7–4.0)
MCHC: 33.7 g/dL (ref 30.0–36.0)
MCV: 87 fl (ref 78.0–100.0)
Monocytes Absolute: 0.5 10*3/uL (ref 0.1–1.0)
Monocytes Relative: 8.9 % (ref 3.0–12.0)
Neutro Abs: 3.2 10*3/uL (ref 1.4–7.7)
Neutrophils Relative %: 54.3 % (ref 43.0–77.0)
Platelets: 197 10*3/uL (ref 150.0–400.0)
RBC: 5.41 Mil/uL (ref 4.22–5.81)
RDW: 13.2 % (ref 11.5–15.5)
WBC: 5.8 10*3/uL (ref 4.0–10.5)

## 2022-06-14 LAB — COMPREHENSIVE METABOLIC PANEL
ALT: 14 U/L (ref 0–53)
AST: 20 U/L (ref 0–37)
Albumin: 5.1 g/dL (ref 3.5–5.2)
Alkaline Phosphatase: 37 U/L — ABNORMAL LOW (ref 39–117)
BUN: 16 mg/dL (ref 6–23)
CO2: 28 mEq/L (ref 19–32)
Calcium: 9.8 mg/dL (ref 8.4–10.5)
Chloride: 102 mEq/L (ref 96–112)
Creatinine, Ser: 1.15 mg/dL (ref 0.40–1.50)
GFR: 74.27 mL/min (ref >60.00)
Glucose, Bld: 88 mg/dL (ref 70–99)
Potassium: 4.2 mEq/L (ref 3.5–5.1)
Sodium: 139 mEq/L (ref 135–145)
Total Bilirubin: 0.9 mg/dL (ref 0.2–1.2)
Total Protein: 7.4 g/dL (ref 6.0–8.3)

## 2022-06-14 LAB — LIPID PANEL
Cholesterol: 206 mg/dL — ABNORMAL HIGH (ref 0–200)
HDL: 83.7 mg/dL (ref >39.00)
LDL Cholesterol: 112 mg/dL — ABNORMAL HIGH (ref 0–99)
NonHDL: 121.92
Total CHOL/HDL Ratio: 2
Triglycerides: 52 mg/dL (ref 0.0–149.0)
VLDL: 10.4 mg/dL (ref 0.0–40.0)

## 2022-06-14 MED ORDER — METOPROLOL SUCCINATE ER 50 MG PO TB24: 50.0000 mg | ORAL_TABLET | Freq: Every day | ORAL | 3 refills | Status: DC

## 2022-06-14 NOTE — Progress Notes (Signed)
Subjective:    Jonathan Mcguire is a 51 y.o. male and is here for a comprehensive physical exam.  HPI  Health Maintenance Due  Topic Date Due   Hepatitis C Screening  Never done   COLONOSCOPY (Pts 45-26yrs Insurance coverage will need to be confirmed)  Never done   COVID-19 Vaccine (2 - Pfizer series) 10/18/2020    Acute Concerns: None  Chronic Issues: HTN Currently taking no medication. At home blood pressure readings are: checked regularly. States his blood pressure at home has been ranging from 135-140 systolic. Was taking Losartan in the past. Patient denies chest pain, SOB, blurred vision, dizziness, unusual headaches, lower leg swelling. Patient is compliant with medication. Denies excessive caffeine intake, stimulant usage, excessive alcohol intake, or increase in salt consumption.  BP Readings from Last 3 Encounters:  06/14/22 (!) 150/96  09/13/20 (!) 170/97  06/02/20 (!) 178/88    Anxiety/Depression  Patient is currently taking Lorazepam ER 1.5 mg daily. He is tolerating his medication well with no side effects.  Despite this, symptoms are well controlled. He is following up with behavioral health at this time. Denies SOB or chest pain. Denies any worsening symptoms.   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  Weight -- 134 Ib (60.9 kg) Weight history Wt Readings from Last 10 Encounters:  06/14/22 134 lb 4 oz (60.9 kg)  06/02/20 134 lb 6.4 oz (61 kg)  01/27/20 136 lb 6.1 oz (61.9 kg)  12/23/19 136 lb 6.1 oz (61.9 kg)  11/12/18 136 lb 8 oz (61.9 kg)  10/23/18 137 lb (62.1 kg)  10/01/18 134 lb 3.2 oz (60.9 kg)  03/27/18 140 lb 9.6 oz (63.8 kg)  02/22/18 140 lb 6.4 oz (63.7 kg)  01/28/18 142 lb (64.4 kg)   Body mass index is 20.41 kg/m. Mood -- Better  Tobacco use -- None  Tobacco Use: Low Risk   (06/14/2022)   Patient History    Smoking Tobacco Use: Never    Smokeless Tobacco Use: Never    Passive Exposure: Not on file    Alcohol use ---  reports current alcohol use of about 3.0 - 5.0 standard drinks of alcohol per week.      06/14/2022    2:06 PM  Depression screen PHQ 2/9  Decreased Interest 0  Down, Depressed, Hopeless 0  PHQ - 2 Score 0  Altered sleeping 0  Tired, decreased energy 1  Change in appetite 0  Feeling bad or failure about yourself  0  Trouble concentrating 0  Moving slowly or fidgety/restless 0  Suicidal thoughts 0  PHQ-9 Score 1  Difficult doing work/chores Not difficult at all     Other providers/specialists: Patient Care Team: Jarold Motto, [place] as PCP - General (Physician Assistant) Marykay Lex, MD as PCP - Cardiology (Cardiology) Dr. Wynell Balloon (Ophthalmology) Dr. Dannielle Burn (Dentistry)   PMHx, SurgHx, SocialHx, Medications, and Allergies were reviewed in the Visit Navigator and updated as appropriate.   Past Medical History:  Diagnosis Date   Essential hypertension    MIld Tricuspid regurgitation    a. 02/2017 Echo: EF 60-65%, no rwma, nl LA/RA size, nl RV fxn, mild TR.   Palpitations    Raynaud disease      Past Surgical History:  Procedure Laterality Date   FRACTURE SURGERY Left 1987   Left  leg, compound fx   TONSILLECTOMY Bilateral 1980   TRANSTHORACIC ECHOCARDIOGRAM  12/2017   NORMAL Study.  EF 60-65% with no regional wall motion normalities and no valvular lesions.  (Notably no evidence of Ebstein's anomaly.     Family History  Problem Relation Age of Onset   Hypertension Mother    Cancer Father        neck   Cancer Maternal Grandmother        pancreatic   Heart disease Maternal Grandfather    Stroke Maternal Grandfather        Tobacco abuse   Cancer - Other Paternal Grandmother        Kidney   Colon cancer Neg Hx     Social History   Tobacco Use   Smoking status: Never   Smokeless tobacco:  Never  Substance Use Topics   Alcohol use: Yes    Alcohol/week: 3.0 - 5.0 standard drinks of alcohol    Types: 2 - 3 Cans of beer, 1 - 2 Glasses of wine per week   Drug use: No    Review of Systems:   Review of Systems  Constitutional:  Negative for chills, fever, malaise/fatigue and weight loss.  HENT:  Negative for hearing loss, sinus pain and sore throat.   Respiratory:  Negative for cough and hemoptysis.   Cardiovascular:  Negative for chest pain, palpitations, leg swelling and PND.  Gastrointestinal:  Negative for abdominal pain, constipation, diarrhea, heartburn, nausea and vomiting.  Genitourinary:  Negative for dysuria, frequency and urgency.  Musculoskeletal:  Negative for back pain, myalgias and neck pain.  Skin:  Negative for itching and rash.  Neurological:  Negative for dizziness, tingling, seizures and headaches.  Endo/Heme/Allergies:  Negative for polydipsia.  Psychiatric/Behavioral:  Negative for depression. The patient is not nervous/anxious.      Objective:   Vitals:   06/14/22 1349 06/14/22 1455  BP: (!) 150/94 (!) 150/96  Pulse: 74 (!) 58  Temp: 98.4 F (36.9 C)   SpO2: 98%    Body mass index is 20.41 kg/m.  General Appearance:  Alert, cooperative, no distress, appears stated age  Head:  Normocephalic, without obvious abnormality, atraumatic  Eyes:  PERRL, conjunctiva/corneas clear, EOM's intact, fundi benign, both eyes       Ears:  Normal TM's and external ear canals, both ears  Nose: Nares normal, septum midline, mucosa normal, no drainage    or sinus tenderness  Throat: Lips, mucosa, and tongue normal; teeth and gums normal  Neck: Supple, symmetrical, trachea midline, no adenopathy; thyroid:  No enlargement/tenderness/nodules; no carotit bruit or JVD  Back:   Symmetric, no curvature, ROM normal, no CVA tenderness  Lungs:   Clear to auscultation bilaterally, respirations unlabored  Chest wall:  No tenderness or deformity  Heart:  Regular rate and  rhythm, S1 and S2 normal, no murmur, rub   or gallop  Abdomen:   Soft, non-tender, bowel sounds active all four quadrants, no masses, no organomegaly  Extremities: Extremities normal, atraumatic, no cyanosis or edema  Prostate: Not done.   Skin: Skin color, texture, turgor normal, no rashes or lesions  Lymph nodes: Cervical, supraclavicular, and axillary nodes normal  Neurologic: CNII-XII grossly intact. Normal strength, sensation and reflexes throughout    Assessment/Plan:   Routine physical examination Today patient counseled on age appropriate routine health concerns for screening and prevention, each reviewed and up to date or declined. Immunizations reviewed and up to date or declined. Labs ordered and reviewed. Risk factors  for depression reviewed and negative. Hearing function and visual acuity are intact. ADLs screened and addressed as needed. Functional ability and level of safety reviewed and appropriate. Education, counseling and referrals performed based on assessed risks today. Patient provided with a copy of personalized plan for preventive services.  Essential hypertension Above goal but home readings are normotensive Continue metoprolol 50 mg daily Follow-up in 1 year, sooner if concerns Continue to monitor BP at home and call if concerning readings or sx develop  Anxiety Well controlled per patient Mgmt per psych  Special screening for malignant neoplasms, colon Referral placed  Encounter for lipid screening for cardiovascular disease Update lipid panel and provide recommendations accordingly  Encounter for screening for other viral diseases Update Hep C screen  Need for prophylactic vaccination and inoculation against varicella First Shingrix today   Patient Counseling: [x]   Nutrition: Stressed importance of moderation in sodium/caffeine intake, saturated fat and cholesterol, caloric balance, sufficient intake of fresh fruits, vegetables, and fiber.  [x]    Stressed the importance of regular exercise.   []   Substance Abuse: Discussed cessation/primary prevention of tobacco, alcohol, or other drug use; driving or other dangerous activities under the influence; availability of treatment for abuse.   [x]   Injury prevention: Discussed safety belts, safety helmets, smoke detector, smoking near bedding or upholstery.   []   Sexuality: Discussed sexually transmitted diseases, partner selection, use of condoms, avoidance of unintended pregnancy  and contraceptive alternatives.   [x]   Dental health: Discussed importance of regular tooth brushing, flossing, and dental visits.  [x]   Health maintenance and immunizations reviewed. Please refer to Health maintenance section.     I,Savera Zaman,acting as a for , PA.,have documented all relevant documentation on the behalf of , PA,as directed by  , PA while in the presence of , .   I, , Neurosurgeon, have reviewed all documentation for this visit. The documentation on 06/14/22 for the exam, diagnosis, procedures, and orders are all accurate and complete.   Jarold Motto, PA-C [facility]

## 2022-06-14 NOTE — Patient Instructions (Addendum)
It was great to see you!  We are putting in a referral for your colonoscopy Please go to the dentist  Follow-up in 2-6 months for second and final shingles vaccine  Please go to the lab for blood work.   Our office will call you with your results unless you have chosen to receive results via MyChart.  If your blood work is normal we will follow-up each year for physicals and as scheduled for chronic medical problems.  If anything is abnormal we will treat accordingly and get you in for a follow-up.  Take care,  Lelon Mast

## 2022-06-15 LAB — HEPATITIS C ANTIBODY: Hepatitis C Ab: NONREACTIVE

## 2022-08-22 ENCOUNTER — Ambulatory Visit (INDEPENDENT_AMBULATORY_CARE_PROVIDER_SITE_OTHER): Payer: BC Managed Care – PPO

## 2022-08-22 DIAGNOSIS — Z23 Encounter for immunization: Secondary | ICD-10-CM | POA: Diagnosis not present

## 2022-08-28 ENCOUNTER — Encounter: Payer: Self-pay | Admitting: Psychiatry

## 2022-08-28 ENCOUNTER — Ambulatory Visit: Payer: BC Managed Care – PPO | Admitting: Psychiatry

## 2022-08-28 DIAGNOSIS — F5105 Insomnia due to other mental disorder: Secondary | ICD-10-CM

## 2022-08-28 DIAGNOSIS — F411 Generalized anxiety disorder: Secondary | ICD-10-CM

## 2022-08-28 MED ORDER — VILAZODONE HCL 20 MG PO TABS: ORAL_TABLET | ORAL | 1 refills | Status: DC

## 2022-08-28 MED ORDER — LORAZEPAM 0.5 MG PO TABS: 0.5000 mg | ORAL_TABLET | Freq: Three times a day (TID) | ORAL | 5 refills | Status: DC

## 2022-08-28 MED ORDER — LORAZEPAM 0.5 MG PO TABS: 0.5000 mg | ORAL_TABLET | Freq: Three times a day (TID) | ORAL | 0 refills | Status: DC

## 2022-08-28 NOTE — Progress Notes (Signed)
Jonathan Mcguire [id number] 1971/04/14 51 y.o.  Subjective:   Patient ID:  Jonathan Mcguire is a 51 y.o. (DOB 09/29/1971) male.  Chief Complaint:  Chief Complaint  Patient presents with   Follow-up   Anxiety     HPI Jonathan Mcguire presents to the office today for follow-up of anxiety.  Has used lorazepam 0.25 mg BID and 0.5 mg HS. SE sometimes a little tired in the afternoon.  3 main sx lightheaded, GI, palpitations.  All are better except some residual GI.  A little tired.  Awesome that he sleeps well and better than ever.  Doesn't feel as well rested as he'd like.  But no worse.   Wonders about whether this is really anxiety.  He does not consciously feel stressed or anxious but has had symptoms somatically that are consistent with anxiety.  The somatic symptoms have had a negative medical work-up.  He wonders if some medical underlying problem may have been mist because his symptoms had a fairly abrupt onset 4 years ago.  He wonders about how long to take the medication.  01/31/22 appt noted: Pending Genesight testing being sent again. Anxiety with somatic sx GI and chest palpitations. July August took lorazepam 0.25 mg BID and 0.5 mg HS with questionable benefit and weaned off daytime doses Sept and October. Struggled with sx and sleep with less than 0.5 mg HS.  Good sleep with it. Still taking 0.5 mg HS and lately more in daytime 0.5 mg AM and HS.  Better with it.  No SE Chronically ambivalent about medication.   Went to [employer] end of October.  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  Took supplements and increased metoprolol 25  to 50 mg daily without clear benefit with for palpitations. GI px are better.   Wife thinks he overreacts with adrenaline. Multiple ER visits and cardiac evals for palpitations negative.  Still wonders. Plan: More somatic anxiety without low dose lorazepam.  Rec he try it it higher at 0.5 mg TID bc no SE.  02/27/2022  appointment with the following noted: Lorazepam feels great.  Zero sx re: chest issues. No SE.   Feels great to have gone a month wihtot palpitations, chest pain, worry over cardiac events.  Patient reports stable mood and denies depressed or irritable moods.  Patient denies any recent difficulty with anxiety.  Patient denies difficulty with sleep initiation or maintenance. Denies appetite disturbance.  Patient reports that energy and motivation have been good.  Patient denies any difficulty with concentration.  Patient denies any suicidal ideation. Only thing with zero SE. EXCEPTWHEN FORGETS MIDDAY DOSE THEN GETS ANXIOUS AND THIS HAPPENS ON OCCASSIONS. Has low T and wonders about it. UP and down with GI but not much as before.  Original complaints head, chest, and GI. Plan: Switch to Lorazepam ER 1.5 mg daily to prevent midd day withdrawal  08/28/2022 appointment noted: Doesn't think Lorazepam ER is working well enough and switched back to lorazepam 0.5 mg TID. Some of the chest palpitations came back on it and didn't feel there was enough benefit.   No trouble falling asleep but then awakens at 5 hours and can struggle with some insomnia thereafter.  Somewhat manageable.   Tolerating meds.   No new medical concerns but still has t the old GI concerns like before but not the chest sx. Struggle late in the day with tiredness or head in a funk.  Thinks it might be metoprolol. No sig  SE.   Still may seek Integrative therapy.    Past Psychiatric Medication Trials: Hx Lexapro 5 at least 4 weeks, 5 mg with SE lightheaded Buspirone 5 TID SE Hydroxyzine. Zoloft 25 lightheaded Lorazepam 0.5 works without SE.  Helps palpitations and calms me down.  Limited use. Magnesium for few weeks [place] Office Visit from 06/14/2022 in [place] Visit from 06/02/2020 in [place] Visit from 01/27/2020 in [place] Visit from 12/23/2019 in [place] Visit from 10/23/2018 in [place]  Total GAD-7 Score 3 6 5 11 8       [place] Visit from 06/14/2022 in [place] Visit from 06/02/2020 in [place] Visit from 01/28/2018 in [place] Visit from 01/22/2017 in [place]  PHQ-2 Total Score 0 0 0 0  PHQ-9 Total Score 1 -- -- --        Review of Systems:  Review of Systems  Cardiovascular:  Negative for chest pain and palpitations.  Gastrointestinal:  Positive for abdominal pain.  Neurological:  Negative for tremors and weakness.    Medications: I have reviewed the patient's current medications.  Current Outpatient Medications  Medication Sig Dispense Refill   metoprolol succinate (TOPROL XL) 50 MG 24 hr tablet Take 1 tablet (50 mg total) by mouth daily. Take with or immediately following a meal. 90 tablet 3   Vilazodone HCl 20 MG TABS 1/2 tablet daily for 1 week then 1 daily 30 tablet 1   LORazepam (ATIVAN) 0.5 MG tablet Take 1 tablet (0.5 mg total) by mouth every 8 (eight) hours. 90 tablet 5   No current facility-administered medications for this visit.    Medication Side Effects: Other: perhaps slight tiredness  Allergies: No Known Allergies  Past Medical History:  Diagnosis Date   Essential hypertension    MIld Tricuspid regurgitation    a. 02/2017 Echo: EF 60-65%, no rwma, nl LA/RA size, nl RV fxn, mild TR.   Palpitations    Raynaud disease     Past Medical History, Surgical history, Social history, and Family history were reviewed and updated as appropriate.   Please see review of systems for further details on the patient's review from today.   Objective:   Physical Exam:  There were no vitals taken for this visit.  Physical Exam Constitutional:       General: He is not in acute distress. Musculoskeletal:        General: No deformity.  Neurological:     Mental Status: He is alert and oriented to person, place, and time.     Coordination: Coordination normal.  Psychiatric:        Attention and Perception: Attention and perception normal. He does not perceive auditory or visual hallucinations.        Mood and Affect: Mood is not anxious or depressed. Affect is not labile, blunt, angry or inappropriate.        Speech: Speech normal. Speech is not slurred.        Behavior: Behavior normal.        Thought Content: Thought content normal. Thought content is not paranoid or delusional. Thought content does not include homicidal or suicidal ideation. Thought content does not include suicidal plan.        Cognition and Memory: Cognition  and memory normal.        Judgment: Judgment normal.     Comments: Insight intact Anxiety resolved.    Lab Review:     Component Value Date/Time   NA 139 06/14/2022 1450   NA 141 12/13/2017 1043   K 4.2 06/14/2022 1450   CL 102 06/14/2022 1450   CO2 28 06/14/2022 1450   GLUCOSE 88 06/14/2022 1450   BUN 16 06/14/2022 1450   BUN 14 12/13/2017 1043   CREATININE 1.15 06/14/2022 1450   CREATININE 1.13 02/16/2017 1028   CALCIUM 9.8 06/14/2022 1450   PROT 7.4 06/14/2022 1450   ALBUMIN 5.1 06/14/2022 1450   AST 20 06/14/2022 1450   ALT 14 06/14/2022 1450   ALKPHOS 37 (L) 06/14/2022 1450   BILITOT 0.9 06/14/2022 1450   GFRNONAA 77 12/13/2017 1043   GFRAA 89 12/13/2017 1043       Component Value Date/Time   WBC 5.8 06/14/2022 1450   RBC 5.41 06/14/2022 1450   HGB 15.9 06/14/2022 1450   HCT 47.1 06/14/2022 1450   PLT 197.0 06/14/2022 1450   MCV 87.0 06/14/2022 1450   MCH 29.1 01/25/2017 1145   MCHC 33.7 06/14/2022 1450   RDW 13.2 06/14/2022 1450   LYMPHSABS 2.0 06/14/2022 1450   MONOABS 0.5 06/14/2022 1450   EOSABS 0.1 06/14/2022 1450   BASOSABS 0.0 06/14/2022 1450    No results found  for: "POCLITH", "LITHIUM"   No results found for: "PHENYTOIN", "PHENOBARB", "VALPROATE", "CBMZ"   .res Assessment: Plan:    Josean was seen today for follow-up and anxiety.  Diagnoses and all orders for this visit:  Generalized anxiety disorder -     Discontinue: LORazepam (ATIVAN) 0.5 MG tablet; Take 1 tablet (0.5 mg total) by mouth every 8 (eight) hours. -     LORazepam (ATIVAN) 0.5 MG tablet; Take 1 tablet (0.5 mg total) by mouth every 8 (eight) hours. -     Vilazodone HCl 20 MG TABS; 1/2 tablet daily for 1 week then 1 daily  Insomnia due to mental condition -     Discontinue: LORazepam (ATIVAN) 0.5 MG tablet; Take 1 tablet (0.5 mg total) by mouth every 8 (eight) hours. -     LORazepam (ATIVAN) 0.5 MG tablet; Take 1 tablet (0.5 mg total) by mouth every 8 (eight) hours.  Other orders -     Discontinue: LORazepam (ATIVAN) 0.5 MG tablet; Take 1 tablet (0.5 mg total) by mouth every 8 (eight) hours.   Greater than 50% of 30-minute face to face time with patient was spent on counseling and coordination of care. We discussed whether generalized anxiety disorder is an accurate diagnosis or not.  He does not consciously feel anxious as noted.  However the somatic symptoms that are consistent with anxiety improved with the lorazepam.  He  He he is sleeping better then in a long time.  He still wonders whether there might be some underlying medical problem that has been mist that is causing his symptoms.  He may pursue an integrative alternative medicine evaluation because he wonders if perhaps he has chronic Lyme disease.  We discussed the difference between traditional [place] perspectives and alternative medicine perspectives or integrative medicine.  We discussed the short-term risks associated with benzodiazepines including sedation and increased fall risk among others.  Discussed long-term side effect risk including dependence, potential withdrawal symptoms, and the potential eventual  dose-related risk of dementia.  But recent studies from 2020 dispute this association between benzodiazepines and dementia  risk. Newer studies in 2020 do not support an association with dementia. More somatic anxiety without low dose lorazepam.  Clear benefit with lorazepam 0.5 mg TID. Better with lorazepam 0.5 mg TID than with ER.  Try to be consistent. . Disc pros and cons of LT use of BZ.  It works.   Disc Genesight testing in detail and what it says and doesn't say.  Disc this again .  SS on serotonin transporter and increased risk SE with SSRIs. Best alternative option Viibryd 10 for a week then 20 mg daily. He wants to pursue this  Follow-up 2-3 mos  Lynder Parents MD, DFAPA   Please see After Visit Summary for patient specific instructions.  No future appointments.   No orders of the defined types were placed in this encounter.    -------------------------------

## 2022-08-28 NOTE — Patient Instructions (Signed)
Start vilazidone 1/2 of 20 mg tablet with meal for 1 week then 1 daily. If it works after a month or 2 then reduce lorazepam by 1 tablet every 2 weeks.

## 2022-09-04 ENCOUNTER — Encounter: Payer: Self-pay | Admitting: *Deleted

## 2022-09-25 ENCOUNTER — Ambulatory Visit: Payer: BC Managed Care – PPO | Admitting: Psychiatry

## 2022-10-24 ENCOUNTER — Other Ambulatory Visit: Payer: Self-pay | Admitting: Psychiatry

## 2022-10-24 DIAGNOSIS — F411 Generalized anxiety disorder: Secondary | ICD-10-CM

## 2022-11-14 ENCOUNTER — Encounter: Payer: Self-pay | Admitting: Psychiatry

## 2022-11-14 ENCOUNTER — Ambulatory Visit: Payer: BC Managed Care – PPO | Admitting: Psychiatry

## 2022-11-14 DIAGNOSIS — F411 Generalized anxiety disorder: Secondary | ICD-10-CM

## 2022-11-14 DIAGNOSIS — F5105 Insomnia due to other mental disorder: Secondary | ICD-10-CM

## 2022-11-14 NOTE — Progress Notes (Signed)
Jonathan Mcguire [id number] 1971/04/14 51 y.o.  Subjective:   Patient ID:  Jonathan Mcguire is a 51 y.o. (DOB 09/29/1971) male.  Chief Complaint:  Chief Complaint  Patient presents with   Follow-up   Anxiety     HPI Jonathan Mcguire presents to the office today for follow-up of anxiety.  Has used lorazepam 0.25 mg BID and 0.5 mg HS. SE sometimes a little tired in the afternoon.  3 main sx lightheaded, GI, palpitations.  All are better except some residual GI.  A little tired.  Awesome that he sleeps well and better than ever.  Doesn't feel as well rested as he'd like.  But no worse.   Wonders about whether this is really anxiety.  He does not consciously feel stressed or anxious but has had symptoms somatically that are consistent with anxiety.  The somatic symptoms have had a negative medical work-up.  He wonders if some medical underlying problem may have been mist because his symptoms had a fairly abrupt onset 4 years ago.  He wonders about how long to take the medication.  01/31/22 appt noted: Pending Genesight testing being sent again. Anxiety with somatic sx GI and chest palpitations. July August took lorazepam 0.25 mg BID and 0.5 mg HS with questionable benefit and weaned off daytime doses Sept and October. Struggled with sx and sleep with less than 0.5 mg HS.  Good sleep with it. Still taking 0.5 mg HS and lately more in daytime 0.5 mg AM and HS.  Better with it.  No SE Chronically ambivalent about medication.   Went to [facility] end of October.  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  Took supplements and increased metoprolol 25  to 50 mg daily without clear benefit with for palpitations. GI px are better.   Wife thinks he overreacts with adrenaline. Multiple ER visits and cardiac evals for palpitations negative.  Still wonders. Plan: More somatic anxiety without low dose lorazepam.  Rec he try it it higher at 0.5 mg TID bc no SE.  02/27/2022  appointment with the following noted: Lorazepam feels great.  Zero sx re: chest issues. No SE.   Feels great to have gone a month wihtot palpitations, chest pain, worry over cardiac events.  Patient reports stable mood and denies depressed or irritable moods.  Patient denies any recent difficulty with anxiety.  Patient denies difficulty with sleep initiation or maintenance. Denies appetite disturbance.  Patient reports that energy and motivation have been good.  Patient denies any difficulty with concentration.  Patient denies any suicidal ideation. Only thing with zero SE. EXCEPTWHEN FORGETS MIDDAY DOSE THEN GETS ANXIOUS AND THIS HAPPENS ON OCCASSIONS. Has low T and wonders about it. UP and down with GI but not much as before.  Original complaints head, chest, and GI. Plan: Switch to Lorazepam ER 1.5 mg daily to prevent midd day withdrawal  08/28/2022 appointment noted: Doesn't think Lorazepam ER is working well enough and switched back to lorazepam 0.5 mg TID. Some of the chest palpitations came back on it and didn't feel there was enough benefit.   No trouble falling asleep but then awakens at 5 hours and can struggle with some insomnia thereafter.  Somewhat manageable.   Tolerating meds.   No new medical concerns but still has t the old GI concerns like before but not the chest sx. Struggle late in the day with tiredness or head in a funk.  Thinks it might be metoprolol. No sig  SE. Plan: Best alternative option Viibryd 10 for a week then 20 mg daily. He wants to pursue this Use lorazepam 0.5 mg TID sched until benefits with Viibryd  11/14/22 appt noted: Still on lorazepam . Tood Viibryd for several weeks 5 weeks then about 5 weeks on Viibryd.  One great benefit, cleared out his head of lightheaded, spacy and foggy and better focus.  That was good.  However felt wired on it. Trouble settling down and trouble sleeping at night and irritation of eyes.  SE libido and anorgasmia.   Didn't retry  lower 10 mg.  Didn't think about it.   Still may seek Integrative therapy.    Past Psychiatric Medication Trials: Hx Lexapro 5 at least 4 weeks, 5 mg with SE lightheaded Viibryd 20 wired and sexual   Buspirone 5 TID SE Hydroxyzine. Zoloft 25 lightheaded Lorazepam 0.5 works without SE.  Helps palpitations and calms me down.  Limited use. Magnesium for few weeks Ashwaganda NR  GAD-7    Flowsheet Row Office Visit from 06/14/2022 in [facility] from 06/02/2020 in [facility] from 01/27/2020 in [facility] from 12/23/2019 in [facility] from 10[facility]23/2018 in [facility]  Total GAD-7 Score 3 6 5 11 8       PHQ2-9    Flowsheet Row Office Visit from 06/14/2022 in [facility] from 06/02/2020 in [facility] from 01/28/2018 in [facility] from 01/22/2017 in [facility]  PHQ-2 Total Score 0 0 0 0  PHQ-9 Total Score 1 -- -- --        Review of Systems:  Review of Systems  Cardiovascular:  Negative for chest pain and palpitations.  Gastrointestinal:  Positive for abdominal pain.  Neurological:  Negative for tremors.    Medications: I have reviewed the patient's current medications.  Current Outpatient Medications  Medication Sig Dispense Refill   LORazepam (ATIVAN) 0.5 MG tablet Take 1 tablet (0.5 mg total) by mouth every 8 (eight) hours. 90 tablet 5   metoprolol succinate (TOPROL XL) 50 MG 24 hr tablet Take 1 tablet (50 mg total) by mouth daily. Take with or immediately following a meal. 90 tablet 3   Vilazodone HCl 20 MG TABS Take 1 tablet (20 mg total) by mouth daily. (Patient not taking: Reported on 11/14/2022) 30 tablet 0   No current facility-administered medications for this visit.     Medication Side Effects: Other: perhaps slight tiredness  Allergies: No Known Allergies  Past Medical History:  Diagnosis Date   Essential hypertension    MIld Tricuspid regurgitation    a. 02/2017 Echo: EF 60-65%, no rwma, nl LA/RA size, nl RV fxn, mild TR.   Palpitations    Raynaud disease     Past Medical History, Surgical history, Social history, and Family history were reviewed and updated as appropriate.   Please see review of systems for further details on the patient's review from today.   Objective:   Physical Exam:  There were no vitals taken for this visit.  Physical Exam Constitutional:      General: He is not in acute distress. Musculoskeletal:        General: No deformity.  Neurological:     Mental Status: He is alert and oriented to person, place, and time.     Coordination: Coordination normal.  Psychiatric:  Attention and Perception: Attention and perception normal. He does not perceive auditory or visual hallucinations.        Mood and Affect: Mood is not anxious or depressed. Affect is not labile, blunt or inappropriate.        Speech: Speech normal. Speech is not slurred.        Behavior: Behavior normal.        Thought Content: Thought content normal. Thought content is not paranoid or delusional. Thought content does not include homicidal or suicidal ideation. Thought content does not include suicidal plan.        Cognition and Memory: Cognition and memory normal.        Judgment: Judgment normal.     Comments: Insight intact Anxiety resolved.     Lab Review:     Component Value Date/Time   NA 139 06/14/2022 1450   NA 141 12/13/2017 1043   K 4.2 06/14/2022 1450   CL 102 06/14/2022 1450   CO2 28 06/14/2022 1450   GLUCOSE 88 06/14/2022 1450   BUN 16 06/14/2022 1450   BUN 14 12/13/2017 1043   CREATININE 1.15 06/14/2022 1450   CREATININE 1.13 02/16/2017 1028   CALCIUM 9.8 06/14/2022 1450   PROT 7.4 06/14/2022 1450   ALBUMIN 5.1  06/14/2022 1450   AST 20 06/14/2022 1450   ALT 14 06/14/2022 1450   ALKPHOS 37 (L) 06/14/2022 1450   BILITOT 0.9 06/14/2022 1450   GFRNONAA 77 12/13/2017 1043   GFRAA 89 12/13/2017 1043       Component Value Date/Time   WBC 5.8 06/14/2022 1450   RBC 5.41 06/14/2022 1450   HGB 15.9 06/14/2022 1450   HCT 47.1 06/14/2022 1450   PLT 197.0 06/14/2022 1450   MCV 87.0 06/14/2022 1450   MCH 29.1 01/25/2017 1145   MCHC 33.7 06/14/2022 1450   RDW 13.2 06/14/2022 1450   LYMPHSABS 2.0 06/14/2022 1450   MONOABS 0.5 06/14/2022 1450   EOSABS 0.1 06/14/2022 1450   BASOSABS 0.0 06/14/2022 1450    No results found for: "POCLITH", "LITHIUM"   No results found for: "PHENYTOIN", "PHENOBARB", "VALPROATE", "CBMZ"   .res Assessment: Plan:    Jonathan Mcguire was seen today for follow-up and anxiety.  Diagnoses and all orders for this visit:  Generalized anxiety disorder  Insomnia due to mental condition   Greater than 50% of 30-minute face to face time with patient was spent on counseling and coordination of care. We discussed whether generalized anxiety disorder is an accurate diagnosis or not.  He does not consciously feel anxious as noted.  However the somatic symptoms that are consistent with anxiety improved with the lorazepam.  He  He he is sleeping better then in a long time.  He still wonders whether there might be some underlying medical problem that has been mist that is causing his symptoms.  He may pursue an integrative alternative medicine evaluation because he wonders if perhaps he has chronic Lyme disease.  We discussed the difference between traditional [place] perspectives and alternative medicine perspectives or integrative medicine.  We discussed the short-term risks associated with benzodiazepines including sedation and increased fall risk among others.  Discussed long-term side effect risk including dependence, potential withdrawal symptoms, and the potential eventual  dose-related risk of dementia.  But recent studies from 2020 dispute this association between benzodiazepines and dementia risk. Newer studies in 2020 do not support an association with dementia. More somatic anxiety without low dose lorazepam.  Clear benefit with lorazepam 0.5 mg  TID. Better with lorazepam 0.5 mg TID than with ER.  Try to be consistent. . Disc pros and cons of LT use of BZ.  It works.   Disc Genesight testing in detail and what it says and doesn't say.  Disc this again .  SS on serotonin transporter and increased risk SE with SSRIs. Best alternative option Viibryd 5-10 mg daily. Other option off label Auvelity  Follow-up 2-3 mos  Meredith Staggers MD, DFAPA   Please see After Visit Summary for patient specific instructions.  No future appointments.    No orders of the defined types were placed in this encounter.    -------------------------------

## 2022-11-22 ENCOUNTER — Encounter: Payer: Self-pay | Admitting: Physician Assistant

## 2022-11-23 ENCOUNTER — Encounter: Payer: Self-pay | Admitting: *Deleted

## 2023-01-12 ENCOUNTER — Encounter: Payer: Self-pay | Admitting: Physician Assistant

## 2023-01-12 DIAGNOSIS — Z1211 Encounter for screening for malignant neoplasm of colon: Secondary | ICD-10-CM

## 2023-01-17 ENCOUNTER — Encounter: Payer: Self-pay | Admitting: Gastroenterology

## 2023-01-17 ENCOUNTER — Ambulatory Visit: Payer: BC Managed Care – PPO | Admitting: Gastroenterology

## 2023-01-17 VITALS — BP 146/92 | HR 82 | Ht 68.0 in | Wt 135.0 lb

## 2023-01-17 DIAGNOSIS — R140 Abdominal distension (gaseous): Secondary | ICD-10-CM

## 2023-01-17 DIAGNOSIS — Z1211 Encounter for screening for malignant neoplasm of colon: Secondary | ICD-10-CM | POA: Diagnosis not present

## 2023-01-17 MED ORDER — NA SULFATE-K SULFATE-MG SULF 17.5-3.13-1.6 GM/177ML PO SOLN: 1.0000 | Freq: Once | ORAL | 0 refills | Status: AC

## 2023-01-17 NOTE — Progress Notes (Signed)
Referring Provider: Inda Coke, PA Primary Care Physician:  Inda Coke, PA  Reason for Consultation:  Bloating   IMPRESSION:  Bloating and early satiety without alarm features Anxiety with history of anxiety-related palpitations Need for colon cancer screening  PLAN: - Discussed diaphragmatic breathing and IBGuard/peppermint for symptomatic relief - EGD to screen for PUD, H pylori, esophagitis, GOO, gastritis - Screening colonoscopy   HPI: Jonathan Mcguire is a 52 y.o. male referred for colonoscopy cancer screening. This appointment was scheduled prior to colonoscopy when he reported bloating. The history is obtained through the patient and review of his electronic health record. He is in Press [employer].  He has a history of anxiety, hypertension and anxiety-induced palpitations that were evaluated by cardiology.   He has had intermittent episodes of gas and bloating over the last 3 years. There is associated early satiety and fullness. No weight loss. However, it developed in December and has lasted longer than in the past.     No abdominal pain. No constipation or diarrhea. No blood or mucous in the stool. Avoiding lactose for 1 week provided no relief.  No relief of bloating  with defecation, eating, or movement.   No NSAIDs.   He was seen at [facility] and had food sensitivity testing.   The first time if happened he had an abdominal x-ray that showed "a pocket of gas." PA Morene Rankins suggested a trial of Miralax and the symptoms.  When symptoms recur he will resume the Miralax.   He has recently had bilateral elbow pain, but this does not seem to occur when GI symptoms are active.   Treatment for anxiety has included Lexapro, Viibryd, buspirone, hydroxyzine, Zoloft, lorazepam, magnesium, and ashwaganda.  There is no known family history of colon cancer or polyps. No family history of stomach cancer or other GI malignancy. No family history of inflammatory  bowel disease or celiac.   Abdominal imaging: - Abdominal x-ray 10/01/18: normal bowel has pattern  Labs 06/14/22: normal CMP, CBC, and lipase   Past Medical History:  Diagnosis Date   Essential hypertension    MIld Tricuspid regurgitation    a. 02/2017 Echo: EF 60-65%, no rwma, nl LA/RA size, nl RV fxn, mild TR.   Palpitations    Raynaud disease     Past Surgical History:  Procedure Laterality Date   FRACTURE SURGERY Left 1987   Left leg, compound fx   TONSILLECTOMY Bilateral 1980   TRANSTHORACIC ECHOCARDIOGRAM  12/2017   NORMAL Study.  EF 60-65% with no regional wall motion normalities and no valvular lesions.  (Notably no evidence of Ebstein's anomaly.     Current Outpatient Medications  Medication Sig Dispense Refill   LORazepam (ATIVAN) 0.5 MG tablet Take 1 tablet (0.5 mg total) by mouth every 8 (eight) hours. 90 tablet 5   metoprolol succinate (TOPROL XL) 50 MG 24 hr tablet Take 1 tablet (50 mg total) by mouth daily. Take with or immediately following a meal. 90 tablet 3   Vilazodone HCl 20 MG TABS Take 1 tablet (20 mg total) by mouth daily. (Patient not taking: Reported on 11/14/2022) 30 tablet 0   No current facility-administered medications for this visit.    Allergies as of 01/17/2023   (No Known Allergies)    Family History  Problem Relation Age of Onset   Hypertension Mother    Cancer Father        neck   Cancer Maternal Grandmother        pancreatic  Heart disease Maternal Grandfather    Stroke Maternal Grandfather        Tobacco abuse   Cancer - Other Paternal Grandmother        Kidney   Colon cancer Neg Hx     Social History   Socioeconomic History   Marital status: Married    Spouse name: Not on file   Number of children: Not on file   Years of education: Not on file   Highest education level: Not on file  Occupational History   Not on file  Tobacco Use   Smoking status: Never   Smokeless tobacco: Never  Substance and Sexual Activity    Alcohol use: Yes    Alcohol/week: 3.0 - 5.0 standard drinks of alcohol    Types: 2 - 3 Cans of beer, 1 - 2 Glasses of wine per week   Drug use: No   Sexual activity: Yes  Other Topics Concern   Not on file  Social History Narrative   Lives in [place] with wife and three children.  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  Active and exercises about 3x/wk.   Social Determinants of Health   Financial Resource Strain: Not on file  Food Insecurity: Not on file  Transportation Needs: Not on file  Physical Activity: Not on file  Stress: Not on file  Social Connections: Not on file  Intimate Partner Violence: Not on file    Review of Systems: 12 system ROS is negative except as noted above with the addition of anxiety, vision changes, and sleeping problems.   Physical Exam: General:   Alert,  well-nourished, pleasant and cooperative in NAD Head:  Normocephalic and atraumatic. Eyes:  Sclera clear, no icterus.   Conjunctiva pink. Ears:  Normal auditory acuity. Nose:  No deformity, discharge,  or lesions. Mouth:  No deformity or lesions.   Neck:  Supple; no masses or thyromegaly. Lungs:  Clear throughout to auscultation.   No wheezes. Heart:  Regular rate and rhythm; no murmurs. Abdomen:  Soft, nontender, nondistended, normal bowel sounds, no rebound or guarding. No hepatosplenomegaly.   Rectal:  Deferred  Msk:  Symmetrical. No boney deformities LAD: No inguinal or umbilical LAD Extremities:  No clubbing or edema. Neurologic:  Alert and  oriented x4;  grossly nonfocal Skin:  Intact without significant lesions or rashes. Psych:  Alert and cooperative. Normal mood and affect.    Jonathan Mcguire L. Tarri Glenn, MD, MPH 01/18/2023, 9:02 AM

## 2023-01-17 NOTE — Patient Instructions (Addendum)
It is time for a colonoscopy.   We discussed an upper endoscopy at the same time.  Trial of peppermint (FDGuard or Heather's Tummy Tamers) may provide some relief of symptoms without needing a prescription therapy.   Belly breathing can help improve your GI symptoms. Sometimes this is called abdominal breathing or diaphragmatic breathing.   Sit upright in a chair. Your spine should be straight, knees bent, shoulders loose, and head and neck relaxed.  You mouth can be slightly open with teeth separated.   Place on hand on your chest and one hand on your abdomen, just below the rib cage.  Breathe in for 4 second through your nose and fee your stomach pushing out against your hand. Try to keep the hand on your chest from moving.   Hold your stomach muscles tight and then start to let your belly deflate as you exhale for 6 seconds. It helps to purse your lips on the exhale like you are blowing through a straw.   I like this video from the [facility] if you would like more information: [email or web address]://[email or web address].[email or web address]

## 2023-01-18 ENCOUNTER — Encounter: Payer: Self-pay | Admitting: Gastroenterology

## 2023-01-24 ENCOUNTER — Ambulatory Visit: Payer: BC Managed Care – PPO | Admitting: Psychiatry

## 2023-02-18 ENCOUNTER — Encounter: Payer: Self-pay | Admitting: Certified Registered Nurse Anesthetist

## 2023-02-19 ENCOUNTER — Encounter: Payer: Self-pay | Admitting: Gastroenterology

## 2023-02-19 ENCOUNTER — Ambulatory Visit (AMBULATORY_SURGERY_CENTER): Payer: BC Managed Care – PPO | Admitting: Gastroenterology

## 2023-02-19 VITALS — BP 102/66 | HR 51 | Temp 97.7°F | Resp 12

## 2023-02-19 DIAGNOSIS — Z1211 Encounter for screening for malignant neoplasm of colon: Secondary | ICD-10-CM

## 2023-02-19 DIAGNOSIS — D122 Benign neoplasm of ascending colon: Secondary | ICD-10-CM | POA: Diagnosis not present

## 2023-02-19 DIAGNOSIS — R6881 Early satiety: Secondary | ICD-10-CM

## 2023-02-19 DIAGNOSIS — R140 Abdominal distension (gaseous): Secondary | ICD-10-CM

## 2023-02-19 DIAGNOSIS — K319 Disease of stomach and duodenum, unspecified: Secondary | ICD-10-CM | POA: Diagnosis not present

## 2023-02-19 DIAGNOSIS — D132 Benign neoplasm of duodenum: Secondary | ICD-10-CM | POA: Diagnosis not present

## 2023-02-19 MED ORDER — SODIUM CHLORIDE 0.9 % IV SOLN: 500.0000 mL | Freq: Once | INTRAVENOUS | Status: DC

## 2023-02-19 NOTE — Op Note (Signed)
[facility] Patient Name: Jonathan Mcguire Procedure Date: 02/19/2023 1:48 PM MRN: [id number] Endoscopist: Thornton Park MD, MD, [id number] Age: 52 Referring MD:  Date of Birth: 1971-11-30 Gender: Male Account #: [id number] Procedure:                Upper GI endoscopy Indications:              Abdominal bloating, Early satiety Medicines:                Monitored Anesthesia Care Procedure:                Pre-Anesthesia Assessment:                           - Prior to the procedure, a History and Physical                            was performed, and patient medications and                            allergies were reviewed. The patient's tolerance of                            previous anesthesia was also reviewed. The risks                            and benefits of the procedure and the sedation                            options and risks were discussed with the patient.                            All questions were answered, and informed consent                            was obtained. Prior Anticoagulants: The patient has                            taken no anticoagulant or antiplatelet agents. ASA                            Grade Assessment: II - A patient with mild systemic                            disease. After reviewing the risks and benefits,                            the patient was deemed in satisfactory condition to                            undergo the procedure.                           After obtaining informed consent, the endoscope was  passed under direct vision. Throughout the                            procedure, the patient's blood pressure, pulse, and                            oxygen saturations were monitored continuously. The                            Olympus Scope [id number] was introduced through the                            mouth, and advanced to the second part of duodenum.                            The upper GI  endoscopy was accomplished without                            difficulty. The patient tolerated the procedure                            well. Scope In: Scope Out: Findings:                 The examined esophagus was normal.                           The entire examined stomach was normal. Biopsies                            were taken from the antrum, body, and fundus with a                            cold forceps for histology. Estimated blood loss                            was minimal.                           The examined duodenum was normal. Biopsies were                            taken with a cold forceps for histology. Estimated                            blood loss was minimal. Complications:            No immediate complications. Estimated Blood Loss:     Estimated blood loss was minimal. Impression:               - Normal esophagus.                           - Normal stomach. Biopsied.                           - Normal  examined duodenum. Biopsied. Recommendation:           - Patient has a contact number available for                            emergencies. The signs and symptoms of potential                            delayed complications were discussed with the                            patient. Return to normal activities tomorrow.                            Written discharge instructions were provided to the                            patient.                           - Resume previous diet.                           - Continue present medications.                           - Await pathology results. Thornton Park MD, MD 02/19/2023 2:41:26 PM This report has been signed electronically.

## 2023-02-19 NOTE — Progress Notes (Signed)
1411 Robinul 0.1 mg IV given due large amount of secretions upon assessment.  MD made aware, vss  

## 2023-02-19 NOTE — Patient Instructions (Addendum)
Handout on polyps given to patient.  Await pathology results from any biopsies taken today. Resume previous diet and continue present medications. Repeat colonoscopy for surveillance will be determined based off of pathology results.   YOU HAD AN ENDOSCOPIC PROCEDURE TODAY AT [facility]:   Refer to the procedure report that was given to you for any specific questions about what was found during the examination.  If the procedure report does not answer your questions, please call your gastroenterologist to clarify.  If you requested that your care partner not be given the details of your procedure findings, then the procedure report has been included in a sealed envelope for you to review at your convenience later.  YOU SHOULD EXPECT: Some feelings of bloating in the abdomen. Passage of more gas than usual.  Walking can help get rid of the air that was put into your GI tract during the procedure and reduce the bloating. If you had a lower endoscopy (such as a colonoscopy or flexible sigmoidoscopy) you may notice spotting of blood in your stool or on the toilet paper. If you underwent a bowel prep for your procedure, you may not have a normal bowel movement for a few days.  Please Note:  You might notice some irritation and congestion in your nose or some drainage.  This is from the oxygen used during your procedure.  There is no need for concern and it should clear up in a day or so.  SYMPTOMS TO REPORT IMMEDIATELY:  Following lower endoscopy (colonoscopy or flexible sigmoidoscopy):  Excessive amounts of blood in the stool  Significant tenderness or worsening of abdominal pains  Swelling of the abdomen that is new, acute  Fever of 100F or higher  Following upper endoscopy (EGD)  Vomiting of blood or coffee ground material  New chest pain or pain under the shoulder blades  Painful or persistently difficult swallowing  New shortness of breath  Fever of 100F or  higher  Black, tarry-looking stools  For urgent or emergent issues, a gastroenterologist can be reached at any hour by calling [phone]. Do not use MyChart messaging for urgent concerns.    DIET:  We do recommend a small meal at first, but then you may proceed to your regular diet.  Drink plenty of fluids but you should avoid alcoholic beverages for 24 hours.  ACTIVITY:  You should plan to take it easy for the rest of today and you should NOT DRIVE or use heavy machinery until tomorrow (because of the sedation medicines used during the test).    FOLLOW UP: Our staff will call the number listed on your records the next business day following your procedure.  We will call around 7:15- 8:00 am to check on you and address any questions or concerns that you may have regarding the information given to you following your procedure. If we do not reach you, we will leave a message.     If any biopsies were taken you will be contacted by phone or by letter within the next 1-3 weeks.  Please call us at [phone] if you have not heard about the biopsies in 3 weeks.    SIGNATURES/CONFIDENTIALITY: You and/or your care partner have signed paperwork which will be entered into your electronic medical record.  These signatures attest to the fact that that the information above on your After Visit Summary has been reviewed and is understood.  Full responsibility of the confidentiality of this discharge information lies  with you and/or your care-partner.

## 2023-02-19 NOTE — Progress Notes (Signed)
Pt's states no medical or surgical changes since previsit or office visit. 

## 2023-02-19 NOTE — Progress Notes (Signed)
Report given to PACU, vss 

## 2023-02-19 NOTE — Op Note (Signed)
[facility] Patient Name: Jonathan Mcguire Procedure Date: 02/19/2023 1:37 PM MRN: [id number] Endoscopist: Thornton Park MD, MD, [id number] Age: 52 Referring MD:  Date of Birth: [birth year] Gender: Male Account #: [id number] Procedure:                Colonoscopy Indications:              Screening for colorectal malignant neoplasm, This                            is the patient's first colonoscopy Medicines:                Monitored Anesthesia Care Procedure:                Pre-Anesthesia Assessment:                           - Prior to the procedure, a History and Physical                            was performed, and patient medications and                            allergies were reviewed. The patient's tolerance of                            previous anesthesia was also reviewed. The risks                            and benefits of the procedure and the sedation                            options and risks were discussed with the patient.                            All questions were answered, and informed consent                            was obtained. Prior Anticoagulants: The patient has                            taken no anticoagulant or antiplatelet agents. ASA                            Grade Assessment: II - A patient with mild systemic                            disease. After reviewing the risks and benefits,                            the patient was deemed in satisfactory condition to                            undergo the procedure.  After obtaining informed consent, the colonoscope                            was passed under direct vision. Throughout the                            procedure, the patient's blood pressure, pulse, and                            oxygen saturations were monitored continuously. The                            CF [id number] was introduced through the anus                            and advanced to  the 3 cm into the ileum. A second                            forward view of the right colon was performed. The                            colonoscopy was performed without difficulty. The                            patient tolerated the procedure well. The quality                            of the bowel preparation was excellent. The                            terminal ileum, ileocecal valve, appendiceal                            orifice, and rectum were photographed. Scope In: 2:22:22 PM Scope Out: 2:33:29 PM Scope Withdrawal Time: 0 hours 8 minutes 36 seconds  Total Procedure Duration: 0 hours 11 minutes 7 seconds  Findings:                 The perianal and digital rectal examinations were                            normal.                           A 3 mm polyp was found in the ascending colon. The                            polyp was sessile. The polyp was removed with a                            cold snare. Resection and retrieval were complete.                            Estimated blood loss was  minimal.                           The exam was otherwise without abnormality on                            direct and retroflexion views. Complications:            No immediate complications. Estimated Blood Loss:     Estimated blood loss was minimal. Impression:               - One 3 mm polyp in the ascending colon, removed                            with a cold snare. Resected and retrieved.                           - The examination was otherwise normal on direct                            and retroflexion views. Recommendation:           - Patient has a contact number available for                            emergencies. The signs and symptoms of potential                            delayed complications were discussed with the                            patient. Return to normal activities tomorrow.                            Written discharge instructions were provided to the                             patient.                           - Resume previous diet.                           - Continue present medications.                           - Await pathology results.                           - Repeat colonoscopy date to be determined after                            pending pathology results are reviewed for                            surveillance.                           -  Emerging evidence supports eating a diet of                            fruits, vegetables, grains, calcium, and yogurt                            while reducing red meat and alcohol may reduce the                            risk of colon cancer.                           - Thank you for allowing me to be involved in your                            colon cancer prevention. Thornton Park MD, MD 02/19/2023 2:44:06 PM This report has been signed electronically.

## 2023-02-19 NOTE — Progress Notes (Signed)
Referring Provider: Inda Coke, PA Primary Care Physician:  Inda Coke, PA   Indication for EGD:  Bloating, early satiety Indication for Colonoscopy:  Colon cancer screening   IMPRESSION:  Bloating, early satiety Need for colon cancer screening Appropriate candidate for monitored anesthesia care  PLAN: EGD and Colonoscopy in the [facility] today   HPI: Jonathan Mcguire is a 52 y.o. male presents for diagnostic EGD and screening colonoscopy.  He has had intermittent episodes of gas and bloating over the last 3 years. There is associated early satiety and fullness. No weight loss. However, it developed in December and has lasted longer than in the past.     No abdominal pain. No constipation or diarrhea. No blood or mucous in the stool. Avoiding lactose for 1 week provided no relief.  No relief of bloating  with defecation, eating, or movement.    No NSAIDs.    He was seen at [facility] and had food sensitivity testing.    The first time if happened he had an abdominal x-ray that showed "a pocket of gas." PA Morene Rankins suggested a trial of Miralax and the symptoms.  When symptoms recur he will resume the Miralax.    He has recently had bilateral elbow pain, but this does not seem to occur when GI symptoms are active.    Treatment for anxiety has included Lexapro, Viibryd, buspirone, hydroxyzine, Zoloft, lorazepam, magnesium, and ashwaganda.   There is no known family history of colon cancer or polyps. No family history of stomach cancer or other GI malignancy. No family history of inflammatory bowel disease or celiac.    Abdominal imaging: - Abdominal x-ray 10/01/18: normal bowel has pattern   Labs 06/14/22: normal CMP, CBC, and lipase   Past Medical History:  Diagnosis Date   Essential hypertension    MIld Tricuspid regurgitation    a. 02/2017 Echo: EF 60-65%, no rwma, nl LA/RA size, nl RV fxn, mild TR.   Palpitations    Raynaud disease     Past  Surgical History:  Procedure Laterality Date   FRACTURE SURGERY Left 1987   Left leg, compound fx   TONSILLECTOMY Bilateral 1980   TRANSTHORACIC ECHOCARDIOGRAM  12/2017   NORMAL Study.  EF 60-65% with no regional wall motion normalities and no valvular lesions.  (Notably no evidence of Ebstein's anomaly.    Current Outpatient Medications  Medication Sig Dispense Refill   LORazepam (ATIVAN) 0.5 MG tablet Take 1 tablet (0.5 mg total) by mouth every 8 (eight) hours. 90 tablet 5   metoprolol succinate (TOPROL XL) 50 MG 24 hr tablet Take 1 tablet (50 mg total) by mouth daily. Take with or immediately following a meal. 90 tablet 3   Vilazodone HCl 20 MG TABS Take 1 tablet (20 mg total) by mouth daily. (Patient not taking: Reported on 11/14/2022) 30 tablet 0   No current facility-administered medications for this visit.    Allergies as of 02/19/2023   (No Known Allergies)    Family History  Problem Relation Age of Onset   Hypertension Mother    Cancer Father        neck   Cancer Maternal Grandmother        pancreatic   Heart disease Maternal Grandfather    Stroke Maternal Grandfather        Tobacco abuse   Cancer - Other Paternal Grandmother        Kidney   Colon cancer Neg Hx      Physical  Exam: General:   Alert,  well-nourished, pleasant and cooperative in NAD Head:  Normocephalic and atraumatic. Eyes:  Sclera clear, no icterus.   Conjunctiva pink. Mouth:  No deformity or lesions.   Neck:  Supple; no masses or thyromegaly. Lungs:  Clear throughout to auscultation.   No wheezes. Heart:  Regular rate and rhythm; no murmurs. Abdomen:  Soft, non-tender, nondistended, normal bowel sounds, no rebound or guarding.  Msk:  Symmetrical. No boney deformities LAD: No inguinal or umbilical LAD Extremities:  No clubbing or edema. Neurologic:  Alert and  oriented x4;  grossly nonfocal Skin:  No obvious rash or bruise. Psych:  Alert and cooperative. Normal mood and  affect.     Studies/Results: No results found.    Jessen Siegman L. Tarri Glenn, MD, MPH 02/19/2023, 1:44 PM

## 2023-02-20 ENCOUNTER — Telehealth: Payer: Self-pay | Admitting: *Deleted

## 2023-02-20 NOTE — Telephone Encounter (Signed)
  Follow up Call-     02/19/2023    2:00 PM  Call back number  Post procedure Call Back phone  # [phone]  Permission to leave phone message Yes     Patient questions:  Do you have a fever, pain , or abdominal swelling? No. Pain Score  0 *  Have you tolerated food without any problems? Yes.    Have you been able to return to your normal activities? Yes.    Do you have any questions about your discharge instructions: Diet   No. Medications  No. Follow up visit  No.  Do you have questions or concerns about your Care? No.  Actions: * If pain score is 4 or above: No action needed, pain <4.

## 2023-03-01 ENCOUNTER — Encounter: Payer: Self-pay | Admitting: Gastroenterology

## 2023-03-01 ENCOUNTER — Encounter: Payer: Self-pay | Admitting: Psychiatry

## 2023-03-01 ENCOUNTER — Ambulatory Visit (INDEPENDENT_AMBULATORY_CARE_PROVIDER_SITE_OTHER): Payer: BC Managed Care – PPO | Admitting: Psychiatry

## 2023-03-01 DIAGNOSIS — F5105 Insomnia due to other mental disorder: Secondary | ICD-10-CM | POA: Diagnosis not present

## 2023-03-01 DIAGNOSIS — F411 Generalized anxiety disorder: Secondary | ICD-10-CM | POA: Diagnosis not present

## 2023-03-01 MED ORDER — LORAZEPAM 0.5 MG PO TABS: 0.5000 mg | ORAL_TABLET | Freq: Three times a day (TID) | ORAL | 5 refills | Status: DC

## 2023-03-01 NOTE — Progress Notes (Signed)
Jonathan Mcguire [id number] 1971/05/10 52 y.o.  Subjective:   Patient ID:  Jonathan Mcguire is a 52 y.o. (DOB [birth year]) male.  Chief Complaint:  Chief Complaint  Patient presents with   Follow-up     HPI Jonathan Mcguire presents to the office today for follow-up of anxiety.  Has used lorazepam 0.25 mg BID and 0.5 mg HS. SE sometimes a little tired in the afternoon.  3 main sx lightheaded, GI, palpitations.  All are better except some residual GI.  A little tired.  Awesome that he sleeps well and better than ever.  Doesn't feel as well rested as he'd like.  But no worse.   Wonders about whether this is really anxiety.  He does not consciously feel stressed or anxious but has had symptoms somatically that are consistent with anxiety.  The somatic symptoms have had a negative medical work-up.  He wonders if some medical underlying problem may have been mist because his symptoms had a fairly abrupt onset 4 years ago.  He wonders about how long to take the medication.  01/31/22 appt noted: Pending Genesight testing being sent again. Anxiety with somatic sx GI and chest palpitations. July August took lorazepam 0.25 mg BID and 0.5 mg HS with questionable benefit and weaned off daytime doses Sept and October. Struggled with sx and sleep with less than 0.5 mg HS.  Good sleep with it. Still taking 0.5 mg HS and lately more in daytime 0.5 mg AM and HS.  Better with it.  No SE Chronically ambivalent about medication.   Went to [employer] end of October.  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  Took supplements and increased metoprolol 25  to 50 mg daily without clear benefit with for palpitations. GI px are better.   Wife thinks he overreacts with adrenaline. Multiple ER visits and cardiac evals for palpitations negative.  Still wonders. Plan: More somatic anxiety without low dose lorazepam.  Rec he try it it higher at 0.5 mg TID bc no SE.  02/27/2022 appointment  with the following noted: Lorazepam feels great.  Zero sx re: chest issues. No SE.   Feels great to have gone a month wihtot palpitations, chest pain, worry over cardiac events.  Patient reports stable mood and denies depressed or irritable moods.  Patient denies any recent difficulty with anxiety.  Patient denies difficulty with sleep initiation or maintenance. Denies appetite disturbance.  Patient reports that energy and motivation have been good.  Patient denies any difficulty with concentration.  Patient denies any suicidal ideation. Only thing with zero SE. EXCEPTWHEN FORGETS MIDDAY DOSE THEN GETS ANXIOUS AND THIS HAPPENS ON OCCASSIONS. Has low T and wonders about it. UP and down with GI but not much as before.  Original complaints head, chest, and GI. Plan: Switch to Lorazepam ER 1.5 mg daily to prevent midd day withdrawal  08/28/2022 appointment noted: Doesn't think Lorazepam ER is working well enough and switched back to lorazepam 0.5 mg TID. Some of the chest palpitations came back on it and didn't feel there was enough benefit.   No trouble falling asleep but then awakens at 5 hours and can struggle with some insomnia thereafter.  Somewhat manageable.   Tolerating meds.   No new medical concerns but still has t the old GI concerns like before but not the chest sx. Struggle late in the day with tiredness or head in a funk.  Thinks it might be metoprolol. No sig SE. Plan: Best  alternative option Viibryd 10 for a week then 20 mg daily. He wants to pursue this Use lorazepam 0.5 mg TID sched until benefits with Viibryd  11/14/22 appt noted: Still on lorazepam . Tood Viibryd for several weeks 5 weeks then about 5 weeks on Viibryd.  One great benefit, cleared out his head of lightheaded, spacy and foggy and better focus.  That was good.  However felt wired on it. Trouble settling down and trouble sleeping at night and irritation of eyes.  SE libido and anorgasmia.   Didn't retry lower 10  mg.  Didn't think about it. Plan: Other option off label Auvelity  03/01/23 appt noted: Never tried Auvelity.  Still has it at home.  Got nervous about taking it.  Worrying over potetential SE.  Didn't have a free weekend to try it.   Nothing's changed.   Benefit lorazepam getting rid of chest anxiety and palpitations and rapid heart rate feelings.  Head feels better with it.  Still some GI issues.  Normal endoscopy and colonoscopy has been a relief.   Still rx metoprololl XL 50 for anxiety. Up and down through the night.  Still may seek Integrative therapy.    Past Psychiatric Medication Trials: Hx Lexapro 5 at least 4 weeks, 5 mg with SE lightheaded Viibryd 20 wired and sexual   Buspirone 5 TID SE Hydroxyzine. metoprolol Zoloft 25 lightheaded Lorazepam 0.5 works without SE.  Helps palpitations and calms me down.  Limited use. Magnesium for few weeks [place] Office Visit from 06/14/2022 in [place] Visit from 06/02/2020 in [facility] Visit from 01/27/2020 in [place] Visit from 12/23/2019 in [place] Visit from 10/23/2018 in [place]  Total GAD-7 Score 3 6 5 11 8       PHQ2-9    [facility] Visit from 06/14/2022 in [place] Visit from 06/02/2020 in [place] Visit from 01/28/2018 in [place] Visit from 01/22/2017 in [place]  PHQ-2 Total Score 0 0 0 0  PHQ-9 Total Score 1 -- -- --        Review of Systems:  Review of Systems  Cardiovascular:  Negative for chest pain.  Gastrointestinal:  Positive for abdominal pain.  Neurological:  Negative for tremors.    Medications: I have reviewed the patient's current medications.  Current Outpatient Medications   Medication Sig Dispense Refill   LORazepam (ATIVAN) 0.5 MG tablet Take 1 tablet (0.5 mg total) by mouth every 8 (eight) hours. 90 tablet 5   metoprolol succinate (TOPROL XL) 50 MG 24 hr tablet Take 1 tablet (50 mg total) by mouth daily. Take with or immediately following a meal. 90 tablet 3   No current facility-administered medications for this visit.    Medication Side Effects: Other: perhaps slight tiredness  Allergies: No Known Allergies  Past Medical History:  Diagnosis Date   Essential hypertension    MIld Tricuspid regurgitation    a. 02/2017 Echo: EF 60-65%, no rwma, nl LA/RA size, nl RV fxn, mild TR.   Palpitations    Raynaud disease     Past Medical History, Surgical history, Social history, and Family history were reviewed and updated as appropriate.   Please see review of systems for further details on the patient's review from today.   Objective:   Physical Exam:  There were no vitals taken for this visit.  Physical Exam Constitutional:      General: He is not in acute distress. Musculoskeletal:        General: No deformity.  Neurological:     Mental Status: He is alert and oriented to person, place, and time.     Coordination: Coordination normal.  Psychiatric:        Attention and Perception: Attention and perception normal. He does not perceive auditory or visual hallucinations.        Mood and Affect: Mood is not anxious or depressed. Affect is not labile or inappropriate.        Speech: Speech normal. Speech is not slurred.        Behavior: Behavior normal.        Thought Content: Thought content normal. Thought content is not paranoid or delusional. Thought content does not include homicidal or suicidal ideation. Thought content does not include suicidal plan.        Cognition and Memory: Cognition and memory normal.        Judgment: Judgment normal.     Comments: Insight intact Anxiety resolved.     Lab Review:     Component Value Date/Time    NA 139 06/14/2022 1450   NA 141 12/13/2017 1043   K 4.2 06/14/2022 1450   CL 102 06/14/2022 1450   CO2 28 06/14/2022 1450   GLUCOSE 88 06/14/2022 1450   BUN 16 06/14/2022 1450   BUN 14 12/13/2017 1043   CREATININE 1.15 06/14/2022 1450   CREATININE 1.13 02/16/2017 1028   CALCIUM 9.8 06/14/2022 1450   PROT 7.4 06/14/2022 1450   ALBUMIN 5.1 06/14/2022 1450   AST 20 06/14/2022 1450   ALT 14 06/14/2022 1450   ALKPHOS 37 (L) 06/14/2022 1450   BILITOT 0.9 06/14/2022 1450   GFRNONAA 77 12/13/2017 1043   GFRAA 89 12/13/2017 1043       Component Value Date/Time   WBC 5.8 06/14/2022 1450   RBC 5.41 06/14/2022 1450   HGB 15.9 06/14/2022 1450   HCT 47.1 06/14/2022 1450   PLT 197.0 06/14/2022 1450   MCV 87.0 06/14/2022 1450   MCH 29.1 01/25/2017 1145   MCHC 33.7 06/14/2022 1450   RDW 13.2 06/14/2022 1450   LYMPHSABS 2.0 06/14/2022 1450   MONOABS 0.5 06/14/2022 1450   EOSABS 0.1 06/14/2022 1450   BASOSABS 0.0 06/14/2022 1450    No results found for: "POCLITH", "LITHIUM"   No results found for: "PHENYTOIN", "PHENOBARB", "VALPROATE", "CBMZ"   .res Assessment: Plan:    Jamail was seen today for follow-up.  Diagnoses and all orders for this visit:  Generalized anxiety disorder  Insomnia due to mental condition   Greater than 50% of 30-minute face to face time with patient was spent on counseling and coordination of care. We discussed whether generalized anxiety disorder is an accurate diagnosis or not.  He does not consciously feel anxious as noted.  However the somatic symptoms that are consistent with anxiety improved with the lorazepam.  He  He he is sleeping better then in a long time.  He still wonders whether there might be some underlying medical problem that has been mist that is causing his symptoms.  He may pursue an integrative alternative medicine evaluation because he wonders if perhaps he has chronic Lyme disease.  We discussed the difference between traditional  [place] perspectives and alternative medicine perspectives or integrative medicine.  We discussed the short-term risks associated with benzodiazepines  including sedation and increased fall risk among others.  Discussed long-term side effect risk including dependence, potential withdrawal symptoms, and the potential eventual dose-related risk of dementia.  But recent studies from 2020 dispute this association between benzodiazepines and dementia risk. Newer studies in 2020 do not support an association with dementia. More somatic anxiety without low dose lorazepam.  Clear benefit with lorazepam 0.5 mg TID. Better with lorazepam 0.5 mg TID than with ER.  Try to be consistent. . Disc pros and cons of LT use of BZ.  It works.   Disc way of doing slow taper if needed.  Still on metoprolol ER 50 for anxiety with benefit but can't increase DT tiredness.    Previously Disc Genesight testing in detail and what it says and doesn't say.  Disc this again .  SS on serotonin transporter and increased risk SE with SSRIs. Option viiibryd 10 Best alternative option option off label Auvelity  Follow-up 3  mos  Lynder Parents MD, DFAPA   Please see After Visit Summary for patient specific instructions.  No future appointments.    No orders of the defined types were placed in this encounter.    -------------------------------

## 2023-03-10 ENCOUNTER — Other Ambulatory Visit: Payer: Self-pay | Admitting: Psychiatry

## 2023-03-10 DIAGNOSIS — F5105 Insomnia due to other mental disorder: Secondary | ICD-10-CM

## 2023-03-10 DIAGNOSIS — F411 Generalized anxiety disorder: Secondary | ICD-10-CM

## 2023-03-12 ENCOUNTER — Encounter: Payer: BC Managed Care – PPO | Admitting: Gastroenterology

## 2023-03-14 ENCOUNTER — Telehealth: Payer: Self-pay | Admitting: Gastroenterology

## 2023-03-14 NOTE — Telephone Encounter (Signed)
The pt has been advised that Dr Tarri Glenn has not reviewed the path results as of today. He will be contacted as soon as available.

## 2023-03-14 NOTE — Telephone Encounter (Signed)
Patient calling looking to have a nurse explain the results of his recent procedure. Please advise

## 2023-03-16 ENCOUNTER — Other Ambulatory Visit: Payer: Self-pay | Admitting: Psychiatry

## 2023-03-16 DIAGNOSIS — F5105 Insomnia due to other mental disorder: Secondary | ICD-10-CM

## 2023-03-16 DIAGNOSIS — F411 Generalized anxiety disorder: Secondary | ICD-10-CM

## 2023-03-26 ENCOUNTER — Encounter: Payer: Self-pay | Admitting: Gastroenterology

## 2023-03-26 NOTE — Telephone Encounter (Signed)
Results reviewed with the patient by phone.  Path letter created documenting the findings and my recommendations.  EGD in 6 months.  Colonoscopy in 5 years.  Patient requests Dr. Leonides Schanz.

## 2023-03-30 ENCOUNTER — Telehealth: Payer: Self-pay | Admitting: Psychiatry

## 2023-03-30 NOTE — Telephone Encounter (Signed)
Jonathan Mcguire stopped by office today concerning Auvelity. States that CC provided samples for him and he would like to continue with the medicine. He is unsure if he needs to continue with samples or if a prescription can be written. Pls rtc [phone] Appt 9/23

## 2023-04-03 ENCOUNTER — Other Ambulatory Visit: Payer: Self-pay

## 2023-04-03 MED ORDER — AUVELITY 45-105 MG PO TBCR: 1.0000 | EXTENDED_RELEASE_TABLET | Freq: Two times a day (BID) | ORAL | 2 refills | Status: DC

## 2023-04-04 ENCOUNTER — Telehealth: Payer: Self-pay | Admitting: Psychiatry

## 2023-04-04 NOTE — Telephone Encounter (Signed)
[facility] sent PA Request for AUVELITY 45-105mg   See CMM

## 2023-04-06 NOTE — Telephone Encounter (Signed)
PA submitted through [facility] through [facility]

## 2023-04-09 NOTE — Telephone Encounter (Signed)
Prior Approval received effective through 04/05/2024 with [employer] for Auvelity 45-105 mg

## 2023-07-04 ENCOUNTER — Other Ambulatory Visit: Payer: Self-pay | Admitting: Physician Assistant

## 2023-09-03 ENCOUNTER — Encounter: Payer: Self-pay | Admitting: Psychiatry

## 2023-09-03 ENCOUNTER — Ambulatory Visit: Payer: BC Managed Care – PPO | Admitting: Psychiatry

## 2023-09-03 DIAGNOSIS — F5105 Insomnia due to other mental disorder: Secondary | ICD-10-CM | POA: Diagnosis not present

## 2023-09-03 DIAGNOSIS — F411 Generalized anxiety disorder: Secondary | ICD-10-CM

## 2023-09-03 MED ORDER — LORAZEPAM 0.5 MG PO TABS: 0.5000 mg | ORAL_TABLET | Freq: Three times a day (TID) | ORAL | 5 refills | Status: DC

## 2023-09-03 NOTE — Progress Notes (Signed)
Jonathan Mcguire [id number] [birth year] 52 y.o.  Subjective:   Patient ID:  Jonathan Mcguire is a 52 y.o. (DOB [birth year]) male.  Chief Complaint:  Chief Complaint  Patient presents with   Follow-up   Anxiety     HPI Jonathan Mcguire presents to the office today for follow-up of anxiety.  Has used lorazepam 0.25 mg BID and 0.5 mg HS. SE sometimes a little tired in the afternoon.  3 main sx lightheaded, GI, palpitations.  All are better except some residual GI.  A little tired.  Awesome that he sleeps well and better than ever.  Doesn't feel as well rested as he'd like.  But no worse.   Wonders about whether this is really anxiety.  He does not consciously feel stressed or anxious but has had symptoms somatically that are consistent with anxiety.  The somatic symptoms have had a negative medical work-up.  He wonders if some medical underlying problem may have been mist because his symptoms had a fairly abrupt onset 4 years ago.  He wonders about how long to take the medication.  01/31/22 appt noted: Pending Genesight testing being sent again. Anxiety with somatic sx GI and chest palpitations. July August took lorazepam 0.25 mg BID and 0.5 mg HS with questionable benefit and weaned off daytime doses Sept and October. Struggled with sx and sleep with less than 0.5 mg HS.  Good sleep with it. Still taking 0.5 mg HS and lately more in daytime 0.5 mg AM and HS.  Better with it.  No SE Chronically ambivalent about medication.   Went to [facility] end of October.  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  Took supplements and increased metoprolol 25  to 50 mg daily without clear benefit with for palpitations. GI px are better.   Wife thinks he overreacts with adrenaline. Multiple ER visits and cardiac evals for palpitations negative.  Still wonders. Plan: More somatic anxiety without low dose lorazepam.  Rec he try it it higher at 0.5 mg TID bc no SE.  02/27/2022  appointment with the following noted: Lorazepam feels great.  Zero sx re: chest issues. No SE.   Feels great to have gone a month wihtot palpitations, chest pain, worry over cardiac events.  Patient reports stable mood and denies depressed or irritable moods.  Patient denies any recent difficulty with anxiety.  Patient denies difficulty with sleep initiation or maintenance. Denies appetite disturbance.  Patient reports that energy and motivation have been good.  Patient denies any difficulty with concentration.  Patient denies any suicidal ideation. Only thing with zero SE. EXCEPTWHEN FORGETS MIDDAY DOSE THEN GETS ANXIOUS AND THIS HAPPENS ON OCCASSIONS. Has low T and wonders about it. UP and down with GI but not much as before.  Original complaints head, chest, and GI. Plan: Switch to Lorazepam ER 1.5 mg daily to prevent midd day withdrawal  08/28/2022 appointment noted: Doesn't think Lorazepam ER is working well enough and switched back to lorazepam 0.5 mg TID. Some of the chest palpitations came back on it and didn't feel there was enough benefit.   No trouble falling asleep but then awakens at 5 hours and can struggle with some insomnia thereafter.  Somewhat manageable.   Tolerating meds.   No new medical concerns but still has t the old GI concerns like before but not the chest sx. Struggle late in the day with tiredness or head in a funk.  Thinks it might be metoprolol. No sig  SE. Plan: Best alternative option Viibryd 10 for a week then 20 mg daily. He wants to pursue this Use lorazepam 0.5 mg TID sched until benefits with Viibryd  11/14/22 appt noted: Still on lorazepam . Tood Viibryd for several weeks 5 weeks then about 5 weeks on Viibryd.  One great benefit, cleared out his head of lightheaded, spacy and foggy and better focus.  That was good.  However felt wired on it. Trouble settling down and trouble sleeping at night and irritation of eyes.  SE libido and anorgasmia.   Didn't retry  lower 10 mg.  Didn't think about it. Plan: Other option off label Auvelity  03/01/23 appt noted: Never tried Auvelity.  Still has it at home.  Got nervous about taking it.  Worrying over potetential SE.  Didn't have a free weekend to try it.   Nothing's changed.   Benefit lorazepam getting rid of chest anxiety and palpitations and rapid heart rate feelings.  Head feels better with it.  Still some GI issues.  Normal endoscopy and colonoscopy has been a relief.   Still rx metoprololl XL 50 for anxiety. Up and down through the night.  09/03/23 appt noted: Med: Auvelity 1 in AM.  (Never tried 2). Lorazepam 0.25 mg BID & 0.5 mg HS Noticed quickly within a few days cleared out my head.  Spaciness and fogginess, lightheadedness resolved quickly. However, assumes SE reduced libido and ejaculation.  ? Impact urination initiation. Able to reduced lorazepam to 0.25 mg BID with more somatic chest pain, palpitations and not feeling as well. Increased to current dose and things settled down.    Still may seek Integrative therapy.    Past Psychiatric Medication Trials: Hx Lexapro 5 at least 4 weeks, 5 mg with SE lightheaded Viibryd 20 wired and sexual   Buspirone 5 TID SE Hydroxyzine. metoprolol Zoloft 25 lightheaded Lorazepam 0.5 works without SE.  Helps palpitations and calms me down.  Limited use. Magnesium for few weeks Ashwaganda NR  GAD-7    Flowsheet Row Office Visit from 06/14/2022 in [facility] from 06/02/2020 in [facility] from 01/27/2020 in [facility] from 12/23/2019 in [facility] from 10/23/2018 in [facility]  Total GAD-7 Score 3 6 5 11 8       PHQ2-9    Flowsheet Row Office Visit from 06/14/2022 in [facility] Visit from 06/02/2020 in [facility]  from 01/28/2018 in [facility] from 01/22/2017 in [facility]  PHQ-2 Total Score 0 0 0 0  PHQ-9 Total Score 1 -- -- --        Review of Systems:  Review of Systems  Cardiovascular:  Negative for chest pain.  Gastrointestinal:  Positive for abdominal pain.  Genitourinary:  Positive for difficulty urinating.  Neurological:  Negative for tremors.    Medications: I have reviewed the patient's current medications.  Current Outpatient Medications  Medication Sig Dispense Refill   Dextromethorphan-buPROPion ER (AUVELITY) 45-105 MG TBCR Take 1 tablet by mouth 2 (two) times daily. (Patient taking differently: Take 1 tablet by mouth once.) 60 tablet 2   metoprolol succinate (TOPROL-XL) 50 MG 24 hr tablet TAKE 1 TABLET BY MOUTH DAILY. TAKE WITH OR IMMEDIATELY FOLLOWING A MEAL. 90 tablet 0   LORazepam (ATIVAN) 0.5 MG tablet Take 1 tablet (0.5 mg total) by mouth every 8 (eight)  hours. 90 tablet 5   No current facility-administered medications for this visit.    Medication Side Effects: Other: perhaps slight tiredness  Allergies: No Known Allergies  Past Medical History:  Diagnosis Date   Essential hypertension    MIld Tricuspid regurgitation    a. 02/2017 Echo: EF 60-65%, no rwma, nl LA/RA size, nl RV fxn, mild TR.   Palpitations    Raynaud disease     Past Medical History, Surgical history, Social history, and Family history were reviewed and updated as appropriate.   Please see review of systems for further details on the patient's review from today.   Objective:   Physical Exam:  There were no vitals taken for this visit.  Physical Exam Constitutional:      General: He is not in acute distress. Musculoskeletal:        General: No deformity.  Neurological:     Mental Status: He is alert and oriented to person, place, and time.     Coordination: Coordination normal.  Psychiatric:        Attention and Perception:  Attention and perception normal. He does not perceive auditory or visual hallucinations.        Mood and Affect: Mood is not anxious or depressed. Affect is not labile or inappropriate.        Speech: Speech normal. Speech is not slurred.        Behavior: Behavior normal.        Thought Content: Thought content normal. Thought content is not paranoid or delusional. Thought content does not include homicidal or suicidal ideation. Thought content does not include suicidal plan.        Cognition and Memory: Cognition and memory normal.        Judgment: Judgment normal.     Comments: Insight intact Anxiety resolved and mind clearer with Auvelity     Lab Review:     Component Value Date/Time   NA 139 06/14/2022 1450   NA 141 12/13/2017 1043   K 4.2 06/14/2022 1450   CL 102 06/14/2022 1450   CO2 28 06/14/2022 1450   GLUCOSE 88 06/14/2022 1450   BUN 16 06/14/2022 1450   BUN 14 12/13/2017 1043   CREATININE 1.15 06/14/2022 1450   CREATININE 1.13 02/16/2017 1028   CALCIUM 9.8 06/14/2022 1450   PROT 7.4 06/14/2022 1450   ALBUMIN 5.1 06/14/2022 1450   AST 20 06/14/2022 1450   ALT 14 06/14/2022 1450   ALKPHOS 37 (L) 06/14/2022 1450   BILITOT 0.9 06/14/2022 1450   GFRNONAA 77 12/13/2017 1043   GFRAA 89 12/13/2017 1043       Component Value Date/Time   WBC 5.8 06/14/2022 1450   RBC 5.41 06/14/2022 1450   HGB 15.9 06/14/2022 1450   HCT 47.1 06/14/2022 1450   PLT 197.0 06/14/2022 1450   MCV 87.0 06/14/2022 1450   MCH 29.1 01/25/2017 1145   MCHC 33.7 06/14/2022 1450   RDW 13.2 06/14/2022 1450   LYMPHSABS 2.0 06/14/2022 1450   MONOABS 0.5 06/14/2022 1450   EOSABS 0.1 06/14/2022 1450   BASOSABS 0.0 06/14/2022 1450    No results found for: "POCLITH", "LITHIUM"   No results found for: "PHENYTOIN", "PHENOBARB", "VALPROATE", "CBMZ"   .res Assessment: Plan:    Jonathan "Thayer Ohm" was seen today for follow-up and anxiety.  Diagnoses and all orders for this visit:  Generalized  anxiety disorder -     LORazepam (ATIVAN) 0.5 MG tablet; Take 1 tablet (0.5 mg total) by  mouth every 8 (eight) hours.  Insomnia due to mental condition -     LORazepam (ATIVAN) 0.5 MG tablet; Take 1 tablet (0.5 mg total) by mouth every 8 (eight) hours.   30-minute face to face time with patient was spent on counseling and coordination of care. We discussed whether generalized anxiety disorder is an accurate diagnosis or not.  He does not consciously feel anxious as noted.  However the somatic symptoms that are consistent with anxiety improved with the lorazepam.  He  He he is sleeping better then in a long time.  He still wonders whether there might be some underlying medical problem that has been mist that is causing his symptoms.  He may pursue an integrative alternative medicine evaluation because he wonders if perhaps he has chronic Lyme disease.  We discussed the difference between traditional [place] perspectives and alternative medicine perspectives or integrative medicine.  Extensive discussion of latest data on risks dementia from untreated anxiety and insomnia.  We discussed the short-term risks associated with benzodiazepines including sedation and increased fall risk among others.  Discussed long-term side effect risk including dependence, potential withdrawal symptoms, and the potential eventual dose-related risk of dementia.  But recent studies from 2020 dispute this association between benzodiazepines and dementia risk. Newer studies in 2020 do not support an association with dementia. More somatic anxiety without low dose lorazepam.  Clear benefit with lorazepam 0.5 mg TID. Better with lorazepam 0.5 mg TID than with ER.  Try to be consistent. . Disc pros and cons of meds includingLT use of BZ.  It works.   Disc way of doing slow taper if needed. Disc SE in detail.    Still on metoprolol ER 50 for anxiety with benefit but can't increase DT tiredness.    Previously Disc Genesight  testing in detail and what it says and doesn't say.  Disc this again .  SS on serotonin transporter and increased risk SE with SSRIs. Option viiibryd 10 Best alternative option option off label Auvelity was effective but some SE sexual and urination difficulty Alternatives off-label cut tab, switch to Wellbutrin SR +/- DM  Follow-up 6  mos  Meredith Staggers MD, DFAPA   Please see After Visit Summary for patient specific instructions.  No future appointments.    No orders of the defined types were placed in this encounter.    -------------------------------

## 2023-09-18 ENCOUNTER — Other Ambulatory Visit: Payer: Self-pay | Admitting: Psychiatry

## 2023-09-18 NOTE — Telephone Encounter (Signed)
Rf appropriate. Lf 04/03/23; lv 09/03/23; visit note spoke about se but no indication of dc. nv 03/05/23;

## 2023-10-07 ENCOUNTER — Other Ambulatory Visit: Payer: Self-pay | Admitting: Physician Assistant

## 2023-11-12 ENCOUNTER — Telehealth: Payer: Self-pay | Admitting: Psychiatry

## 2023-11-12 NOTE — Telephone Encounter (Signed)
Jonathan Mcguire at [facility] LVM @ 12/2 @ 8:30a.  She said they need medical records for the PA for Auvelity.  But previous encounter shows the Auvelity was approved through April 2025.  She said you can fax them to [phone] or call back and answer the questions at [phone], opt 3, opt 4 then opt 1.  Reference # [id number].  Next appt 3/24

## 2023-11-12 NOTE — Telephone Encounter (Signed)
Had to make this new encounter because I hit "sign encounter" before sending it by mistake.   Jonathan Mcguire at [facility] @ 12/2 @ 8:30a.  She said they need medical records for the PA for Auvelity.  But previous encounter from 04/04/23 shows the [name] was approved through April 2025.  She said you can fax the the records to [phone] or call back and answer the questions at [phone], opt 3, opt 4 then opt 1.  Reference # [id number].   Next appt 3/24

## 2023-11-12 NOTE — Telephone Encounter (Signed)
Will review PA. He may have changed insurance since April not sure.

## 2023-11-13 NOTE — Telephone Encounter (Signed)
Noted will fax office notes per request

## 2023-11-13 NOTE — Telephone Encounter (Signed)
Brandy at [facility] lvm at 10:10 regarding previous messages. They needs medical record  faxed to them stating that he has been stable on medication for at least two weeks and what the medication is treated for. Fx: [phone] Ph: [phone] Option 3 and 4 than 2.

## 2023-11-14 ENCOUNTER — Telehealth: Payer: Self-pay

## 2023-11-14 NOTE — Telephone Encounter (Signed)
Contacted pt directly to see if currently taking Auvelity, he reports he has been taking since April 2024 which noted his [employer] approved a PA through 03/2024. Pt reports no change in insurance, he pays $0 out of pocket and receives from [facility] monthly, pt reports he is almost out. Informed him we could get him samples until I figure out what his insurance is questioning.   Contacted [employer] and they report they did have a PA on file effective through 03/2024 but someone initiated a new one and they need to know if pt has been stable on medication for at least 2 weeks and is this medication being used to treat a seizure related or refractory psychiatric disorder. Pt of course has been stable since April 2025, he did report side effects but he outweighed the benefit to side effects and wished to continue for now. According to Dr. Alwyn Ren last note in Sept 2024 it says best alternative option off label Auvelity was effective but noted SE sexual and urination difficulty.   Will need to clarify with Dr. Jennelle Human on his last note and needing documentation stable on medication since April 2024. I have scheduled a peer to peer review for myself with the pharmacist to discuss today, December 4th at 1:30 pm. Representative reports medication is not on formulary which I am sure it was not on formulary in April as well.

## 2023-11-14 NOTE — Telephone Encounter (Signed)
The peer to peer review was a bit late due to the provider running behind, he did contact me again to go over his prior authorization for Auvelity. He reports that the patient has changed plans, which the pt reports he didn't, the plan he picked did not have this particular medication on it covered like his previous plan. Therefore that is why a new prior authorization was initiated. Unfortunately pt must try and fail all formulary alternatives listed. We discussed the ones pt has tried and his side effects, also reported pt is very medication sensitive to medications and in particular the SSRI's. He did take that in consideration and advised the pt has 3 SNRI's left to try unless medical reason why, Duloxetine, Venlafaxine ER, and Desvenlafaxine ER. Informed him we would plan on doing an appeal and would submit the necessary requirements.

## 2024-03-03 ENCOUNTER — Ambulatory Visit (INDEPENDENT_AMBULATORY_CARE_PROVIDER_SITE_OTHER): Payer: BC Managed Care – PPO | Admitting: Psychiatry

## 2024-03-03 ENCOUNTER — Encounter: Payer: Self-pay | Admitting: Psychiatry

## 2024-03-03 DIAGNOSIS — F411 Generalized anxiety disorder: Secondary | ICD-10-CM

## 2024-03-03 DIAGNOSIS — F5105 Insomnia due to other mental disorder: Secondary | ICD-10-CM | POA: Diagnosis not present

## 2024-03-03 MED ORDER — BUPROPION HCL 75 MG PO TABS: 75.0000 mg | ORAL_TABLET | Freq: Two times a day (BID) | ORAL | 0 refills | Status: DC

## 2024-03-03 MED ORDER — DEXTROMETHORPHAN HBR 15 MG/5ML PO LIQD: 15.0000 mg | Freq: Every morning | ORAL | 2 refills | Status: DC

## 2024-03-03 NOTE — Progress Notes (Signed)
 Geary Rufo [id number] 1971-06-09 53 y.o.  Subjective:   Patient ID:  Jonathan Mcguire is a 53 y.o. (DOB December 27, 1970) male.  Chief Complaint:  No chief complaint on file.    HPI Willett Lefeber presents to the office today for follow-up of anxiety.  Has used lorazepam 0.25 mg BID and 0.5 mg HS. SE sometimes a little tired in the afternoon.  3 main sx lightheaded, GI, palpitations.  All are better except some residual GI.  A little tired.  Awesome that he sleeps well and better than ever.  Doesn't feel as well rested as he'd like.  But no worse.   Wonders about whether this is really anxiety.  He does not consciously feel stressed or anxious but has had symptoms somatically that are consistent with anxiety.  The somatic symptoms have had a negative medical work-up.  He wonders if some medical underlying problem may have been mist because his symptoms had a fairly abrupt onset 4 years ago.  He wonders about how long to take the medication.  01/31/22 appt noted: Pending Genesight testing being sent again. Anxiety with somatic sx GI and chest palpitations. July August took lorazepam 0.25 mg BID and 0.5 mg HS with questionable benefit and weaned off daytime doses Sept and October. Struggled with sx and sleep with less than 0.5 mg HS.  Good sleep with it. Still taking 0.5 mg HS and lately more in daytime 0.5 mg AM and HS.  Better with it.  No SE Chronically ambivalent about medication.   Went to [facility] end of October.  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  Took supplements and increased metoprolol 25  to 50 mg daily without clear benefit with for palpitations. GI px are better.   Wife thinks he overreacts with adrenaline. Multiple ER visits and cardiac evals for palpitations negative.  Still wonders. Plan: More somatic anxiety without low dose lorazepam.  Rec he try it it higher at 0.5 mg TID bc no SE.  02/27/2022 appointment with the following  noted: Lorazepam feels great.  Zero sx re: chest issues. No SE.   Feels great to have gone a month wihtot palpitations, chest pain, worry over cardiac events.  Patient reports stable mood and denies depressed or irritable moods.  Patient denies any recent difficulty with anxiety.  Patient denies difficulty with sleep initiation or maintenance. Denies appetite disturbance.  Patient reports that energy and motivation have been good.  Patient denies any difficulty with concentration.  Patient denies any suicidal ideation. Only thing with zero SE. EXCEPTWHEN FORGETS MIDDAY DOSE THEN GETS ANXIOUS AND THIS HAPPENS ON OCCASSIONS. Has low T and wonders about it. UP and down with GI but not much as before.  Original complaints head, chest, and GI. Plan: Switch to Lorazepam ER 1.5 mg daily to prevent midd day withdrawal  08/28/2022 appointment noted: Doesn't think Lorazepam ER is working well enough and switched back to lorazepam 0.5 mg TID. Some of the chest palpitations came back on it and didn't feel there was enough benefit.   No trouble falling asleep but then awakens at 5 hours and can struggle with some insomnia thereafter.  Somewhat manageable.   Tolerating meds.   No new medical concerns but still has t the old GI concerns like before but not the chest sx. Struggle late in the day with tiredness or head in a funk.  Thinks it might be metoprolol. No sig SE. Plan: Best alternative option Viibryd 10 for  a week then 20 mg daily. He wants to pursue this Use lorazepam 0.5 mg TID sched until benefits with Viibryd  11/14/22 appt noted: Still on lorazepam . Tood Viibryd for several weeks 5 weeks then about 5 weeks on Viibryd.  One great benefit, cleared out his head of lightheaded, spacy and foggy and better focus.  That was good.  However felt wired on it. Trouble settling down and trouble sleeping at night and irritation of eyes.  SE libido and anorgasmia.   Didn't retry lower 10 mg.  Didn't think  about it. Plan: Other option off label Auvelity  03/01/23 appt noted: Never tried Auvelity.  Still has it at home.  Got nervous about taking it.  Worrying over potetential SE.  Didn't have a free weekend to try it.   Nothing's changed.   Benefit lorazepam getting rid of chest anxiety and palpitations and rapid heart rate feelings.  Head feels better with it.  Still some GI issues.  Normal endoscopy and colonoscopy has been a relief.   Still rx metoprololl XL 50 for anxiety. Up and down through the night.  09/03/23 appt noted: Med: Auvelity 1 in AM.  (Never tried 2). Lorazepam 0.25 mg BID & 0.5 mg HS Noticed quickly within a few days cleared out my head.  Spaciness and fogginess, lightheadedness resolved quickly. However, assumes SE reduced libido and ejaculation.  ? Impact urination initiation. Able to reduced lorazepam to 0.25 mg BID with more somatic chest pain, palpitations and not feeling as well. Increased to current dose and things settled down.   03/03/24 appt noted: Med:  Auvelity 1 in AM.  (Never tried 2). Reduced to Lorazepam 0.5 mg evening . Stayed on Auvelity.  Has tried cutting in half on and off since here and consistent for 6 weeks.  DT SE urination and ejac.  Is a lot better when not on it but then lose benefit when not on it.   Multiple questions. Feeling well enough to reduce lorazepam.  Still may seek Integrative therapy.    Past Psychiatric Medication Trials: Hx Lexapro 5 at least 4 weeks, 5 mg with SE lightheaded Viibryd 20 wired and sexual   Buspirone 5 TID SE Hydroxyzine. metoprolol Zoloft 25 lightheaded Lorazepam 0.5 works without SE.  Helps palpitations and calms me down.  Limited use. Magnesium for few weeks Ashwaganda NR  GAD-7    Flowsheet Row Office Visit from 06/14/2022 in [facility] Visit from 06/02/2020 in [facility] [employer] Visit from 01/27/2020 in [facility] [employer] [facility] Visit from 12/23/2019 in [facility] [employer] Visit from 10/23/2018 in [facility] [place]  Total GAD-7 Score 3 6 5 11 8       PHQ2-9    Flowsheet Row Office Visit from 06/14/2022 in [facility] [place] Office Visit from 06/02/2020 in [facility] [employer] [facility] Visit from 01/28/2018 in [facility] [employer] Visit from 01/22/2017 in [facility] [place]  PHQ-2 Total Score 0 0 0 0  PHQ-9 Total Score 1 -- -- --        Review of Systems:  Review of Systems  Cardiovascular:  Negative for chest pain.  Gastrointestinal:  Positive for abdominal pain.  Genitourinary:  Positive for difficulty  urinating.  Neurological:  Negative for tremors.    Medications: I have reviewed the patient's current medications.  Current Outpatient Medications  Medication Sig Dispense Refill   AUVELITY 45-105 MG TBCR TAKE ONE TABLET BY MOUTH TWICE DAILY 60 tablet 2   LORazepam (ATIVAN) 0.5 MG tablet Take 1 tablet (0.5 mg total) by mouth every 8 (eight) hours. 90 tablet 5   metoprolol succinate (TOPROL-XL) 50 MG 24 hr tablet TAKE 1 TABLET BY MOUTH DAILY. TAKE WITH OR IMMEDIATELY FOLLOWING A MEAL. 90 tablet 0   No current facility-administered medications for this visit.    Medication Side Effects: Other: perhaps slight tiredness  Allergies: No Known Allergies  Past Medical History:  Diagnosis Date   Essential hypertension    MIld Tricuspid regurgitation    a. 02/2017 Echo: EF 60-65%, no rwma, nl LA/RA size, nl RV fxn, mild TR.   Palpitations    Raynaud disease     Past Medical History, Surgical history, Social history, and Family history were reviewed and updated as appropriate.   Please see review of systems for further details on the patient's review from today.   Objective:    Physical Exam:  There were no vitals taken for this visit.  Physical Exam Constitutional:      General: He is not in acute distress. Musculoskeletal:        General: No deformity.  Neurological:     Mental Status: He is alert and oriented to person, place, and time.     Coordination: Coordination normal.  Psychiatric:        Attention and Perception: Attention and perception normal. He does not perceive auditory or visual hallucinations.        Mood and Affect: Mood is not anxious or depressed. Affect is not labile or inappropriate.        Speech: Speech normal. Speech is not slurred.        Behavior: Behavior normal.        Thought Content: Thought content normal. Thought content is not paranoid or delusional. Thought content does not include homicidal or suicidal ideation. Thought content does not include suicidal plan.        Cognition and Memory: Cognition and memory normal.        Judgment: Judgment normal.     Comments: Insight intact Anxiety resolved and mind clearer with Auvelity     Lab Review:     Component Value Date/Time   NA 139 06/14/2022 1450   NA 141 12/13/2017 1043   K 4.2 06/14/2022 1450   CL 102 06/14/2022 1450   CO2 28 06/14/2022 1450   GLUCOSE 88 06/14/2022 1450   BUN 16 06/14/2022 1450   BUN 14 12/13/2017 1043   CREATININE 1.15 06/14/2022 1450   CREATININE 1.13 02/16/2017 1028   CALCIUM 9.8 06/14/2022 1450   PROT 7.4 06/14/2022 1450   ALBUMIN 5.1 06/14/2022 1450   AST 20 06/14/2022 1450   ALT 14 06/14/2022 1450   ALKPHOS 37 (L) 06/14/2022 1450   BILITOT 0.9 06/14/2022 1450   GFRNONAA 77 12/13/2017 1043   GFRAA 89 12/13/2017 1043       Component Value Date/Time   WBC 5.8 06/14/2022 1450   RBC 5.41 06/14/2022 1450   HGB 15.9 06/14/2022 1450   HCT 47.1 06/14/2022 1450   PLT 197.0 06/14/2022 1450   MCV 87.0 06/14/2022 1450   MCH 29.1 01/25/2017 1145   MCHC 33.7 06/14/2022 1450   RDW 13.2 06/14/2022 1450   LYMPHSABS  2.0 06/14/2022 1450    MONOABS 0.5 06/14/2022 1450   EOSABS 0.1 06/14/2022 1450   BASOSABS 0.0 06/14/2022 1450    No results found for: "POCLITH", "LITHIUM"   No results found for: "PHENYTOIN", "PHENOBARB", "VALPROATE", "CBMZ"   .res Assessment: Plan:    There are no diagnoses linked to this encounter.  30-minute face to face time with patient . We discussed  generalized anxiety disorder .  He does not consciously feel anxious as noted.  However the somatic symptoms that are consistent with anxiety improved with the lorazepam and now with Auvelity.   He  He he is sleeping better then in a long time.    Extensive discussion of latest data on risks dementia from untreated anxiety and insomnia.  We discussed the short-term risks associated with benzodiazepines including sedation and increased fall risk among others.  Discussed long-term side effect risk including dependence, potential withdrawal symptoms, and the potential eventual dose-related risk of dementia.  But recent studies from 2020 dispute this association between benzodiazepines and dementia risk. Newer studies in 2020 do not support an association with dementia. More somatic anxiety without low dose lorazepam.  Clear benefit with lorazepam 0.5 mg TID but able to reduce to just 0.5 mg PM since on Aivelity.  .  Still on metoprolol ER 50 for anxiety with benefit but can't increase DT tiredness.    Previously Disc Genesight testing in detail and what it says and doesn't say.  Disc this again .  SS on serotonin transporter and increased risk SE with SSRIs. Option viiibryd 10 benefit off label Auvelity was effective but some SE sexual and urination difficulty Alternatives off-label cut tab, switch to Wellbutrin SR +/- DM  Plan: Bupropion 75 mg tablet 1/2 tablet in the AM Dextromethorphan 15 mg in the AM  Follow-up 2  mos  Meredith Staggers MD, DFAPA   Please see After Visit Summary for patient specific instructions.  No future  appointments.    No orders of the defined types were placed in this encounter.    -------------------------------

## 2024-03-03 NOTE — Patient Instructions (Addendum)
 Bupropion 75 mg tablet 1/2 tablet in the AM Dextromethorphan 15 mg in the AM

## 2024-04-09 NOTE — Telephone Encounter (Signed)
 No further review

## 2024-04-16 ENCOUNTER — Telehealth: Payer: Self-pay | Admitting: Psychiatry

## 2024-04-16 ENCOUNTER — Other Ambulatory Visit: Payer: Self-pay

## 2024-04-16 DIAGNOSIS — F411 Generalized anxiety disorder: Secondary | ICD-10-CM

## 2024-04-16 DIAGNOSIS — F5105 Insomnia due to other mental disorder: Secondary | ICD-10-CM

## 2024-04-16 MED ORDER — LORAZEPAM 0.5 MG PO TABS: 0.5000 mg | ORAL_TABLET | Freq: Three times a day (TID) | ORAL | 1 refills | Status: DC

## 2024-04-16 NOTE — Telephone Encounter (Signed)
 Pended lorazepam  to [facility] in [place] [facility] [place] [facility].

## 2024-04-16 NOTE — Telephone Encounter (Signed)
 Patient came into office requesting refill for Lorazepam  0.5mg . Ph: [phone] Appt 6/9 Pharmacy  [facility] [address] [place], [place]

## 2024-05-19 ENCOUNTER — Ambulatory Visit: Admitting: Psychiatry

## 2024-05-24 ENCOUNTER — Other Ambulatory Visit: Payer: Self-pay | Admitting: Psychiatry

## 2024-05-24 DIAGNOSIS — F411 Generalized anxiety disorder: Secondary | ICD-10-CM

## 2024-05-25 NOTE — Telephone Encounter (Signed)
 Bupropion  75 mg tablet 1/2 tablet in the AM   Verify dose.

## 2024-08-20 ENCOUNTER — Ambulatory Visit: Admitting: Psychiatry

## 2024-08-26 ENCOUNTER — Ambulatory Visit: Admitting: Psychiatry

## 2024-08-26 ENCOUNTER — Encounter: Payer: Self-pay | Admitting: Psychiatry

## 2024-08-26 DIAGNOSIS — F5105 Insomnia due to other mental disorder: Secondary | ICD-10-CM

## 2024-08-26 DIAGNOSIS — F411 Generalized anxiety disorder: Secondary | ICD-10-CM

## 2024-08-26 MED ORDER — LORAZEPAM 0.5 MG PO TABS: 0.5000 mg | ORAL_TABLET | Freq: Three times a day (TID) | ORAL | 3 refills | Status: DC

## 2024-08-26 NOTE — Progress Notes (Signed)
 Aijalon Kirtz [id number] Jan 09, 1971 53 y.o.  Subjective:   Patient ID:  Matheo Rathbone is a 53 y.o. (DOB [birth year]) male.  Chief Complaint:  Chief Complaint  Patient presents with   Follow-up   Anxiety     HPI Tamara Kenyon presents to the office today for follow-up of anxiety.  Has used lorazepam  0.25 mg BID and 0.5 mg HS. SE sometimes a little tired in the afternoon.  3 main sx lightheaded, GI, palpitations.  All are better except some residual GI.  A little tired.  Awesome that he sleeps well and better than ever.  Doesn't feel as well rested as he'd like.  But no worse.   Wonders about whether this is really anxiety.  He does not consciously feel stressed or anxious but has had symptoms somatically that are consistent with anxiety.  The somatic symptoms have had a negative medical work-up.  He wonders if some medical underlying problem may have been mist because his symptoms had a fairly abrupt onset 4 years ago.  He wonders about how long to take the medication.  01/31/22 appt noted: Pending Genesight testing being sent again. Anxiety with somatic sx GI and chest palpitations. July August took lorazepam  0.25 mg BID and 0.5 mg HS with questionable benefit and weaned off daytime doses Sept and October. Struggled with sx and sleep with less than 0.5 mg HS.  Good sleep with it. Still taking 0.5 mg HS and lately more in daytime 0.5 mg AM and HS.  Better with it.  No SE Chronically ambivalent about medication.   Went to [employer] end of October.  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  Took supplements and increased metoprolol  25  to 50 mg daily without clear benefit with for palpitations. GI px are better.   Wife thinks he overreacts with adrenaline. Multiple ER visits and cardiac evals for palpitations negative.  Still wonders. Plan: More somatic anxiety without low dose lorazepam .  Rec he try it it higher at 0.5 mg TID bc no SE.  02/27/2022  appointment with the following noted: Lorazepam  feels great.  Zero sx re: chest issues. No SE.   Feels great to have gone a month wihtot palpitations, chest pain, worry over cardiac events.  Patient reports stable mood and denies depressed or irritable moods.  Patient denies any recent difficulty with anxiety.  Patient denies difficulty with sleep initiation or maintenance. Denies appetite disturbance.  Patient reports that energy and motivation have been good.  Patient denies any difficulty with concentration.  Patient denies any suicidal ideation. Only thing with zero SE. EXCEPTWHEN FORGETS MIDDAY DOSE THEN GETS ANXIOUS AND THIS HAPPENS ON OCCASSIONS. Has low T and wonders about it. UP and down with GI but not much as before.  Original complaints head, chest, and GI. Plan: Switch to Lorazepam  ER 1.5 mg daily to prevent midd day withdrawal  08/28/2022 appointment noted: Doesn't think Lorazepam  ER is working well enough and switched back to lorazepam  0.5 mg TID. Some of the chest palpitations came back on it and didn't feel there was enough benefit.   No trouble falling asleep but then awakens at 5 hours and can struggle with some insomnia thereafter.  Somewhat manageable.   Tolerating meds.   No new medical concerns but still has t the old GI concerns like before but not the chest sx. Struggle late in the day with tiredness or head in a funk.  Thinks it might be metoprolol . No sig  SE. Plan: Best alternative option Viibryd  10 for a week then 20 mg daily. He wants to pursue this Use lorazepam  0.5 mg TID sched until benefits with Viibryd   11/14/22 appt noted: Still on lorazepam  . Tood Viibryd  for several weeks 5 weeks then about 5 weeks on Viibryd .  One great benefit, cleared out his head of lightheaded, spacy and foggy and better focus.  That was good.  However felt wired on it. Trouble settling down and trouble sleeping at night and irritation of eyes.  SE libido and anorgasmia.   Didn't retry  lower 10 mg.  Didn't think about it. Plan: Other option off label Auvelity   03/01/23 appt noted: Never tried Auvelity .  Still has it at home.  Got nervous about taking it.  Worrying over potetential SE.  Didn't have a free weekend to try it.   Nothing's changed.   Benefit lorazepam  getting rid of chest anxiety and palpitations and rapid heart rate feelings.  Head feels better with it.  Still some GI issues.  Normal endoscopy and colonoscopy has been a relief.   Still rx metoprololl XL 50 for anxiety. Up and down through the night.  09/03/23 appt noted: Med: Auvelity  1 in AM.  (Never tried 2). Lorazepam  0.25 mg BID & 0.5 mg HS Noticed quickly within a few days cleared out my head.  Spaciness and fogginess, lightheadedness resolved quickly. However, assumes SE reduced libido and ejaculation.  ? Impact urination initiation. Able to reduced lorazepam  to 0.25 mg BID with more somatic chest pain, palpitations and not feeling as well. Increased to current dose and things settled down.   03/03/24 appt noted: Med:  Auvelity  1 in AM.  (Never tried 2). Reduced to Lorazepam  0.5 mg evening . Stayed on Auvelity .  Has tried cutting in half on and off since here and consistent for 6 weeks.  DT SE urination and ejac.  Is a lot better when not on it but then lose benefit when not on it.   Multiple questions. Feeling well enough to reduce lorazepam .   08/26/24 appt noted:  Med; not taking Auvelity  1 PM, lorazepam  0.5 mg PM only.  benefit off label Auvelity  was effective but some SE sexual and urination difficulty Ativan  helps him settle down and better sleep.  Occ extra.  Helps the anxiety.   No SE.  Anxiety never goes away but usually manageable.  Gets incr pulse with anxiety about 1 day per month.  Will take extra. Sleep better with Ativan .   D [employer] college with some social anxiety.   Still may seek Integrative therapy.    Past Psychiatric Medication Trials: Hx Lexapro  5 at least 4 weeks, 5 mg with SE  lightheaded Viibryd  20 wired and sexual   Buspirone  5 TID SE Hydroxyzine . metoprolol  Zoloft  25 lightheaded Lorazepam  0.5 works without SE.  Helps palpitations and calms me down.  Limited use. Magnesium for few weeks Ashwaganda NR benefit off label Auvelity  was effective but some SE sexual and urination difficulty Then SE with Wellbutrin  with 15 mg DM  GAD-7    Flowsheet Row Office Visit from 06/14/2022 in [facility] [employer] Visit from 06/02/2020 in [facility] [employer] from 01/27/2020 in [facility] [employer] Visit from 12/23/2019 in [facility] [employer] Visit from 10/23/2018 in [facility] [place] [facility] [address] [place]  Total GAD-7 Score 3 6  5 11 8    PHQ2-9    Flowsheet Row Office Visit from 06/14/2022 in [facility] [employer] Visit from 06/02/2020 in [facility] [employer] Visit from 01/28/2018 in [facility] [employer] Visit from 01/22/2017 in [facility] [place]  PHQ-2 Total Score 0 0 0 0  PHQ-9 Total Score 1 -- -- --     Review of Systems:  Review of Systems  Cardiovascular:  Negative for chest pain.  Gastrointestinal:  Positive for abdominal pain.  Genitourinary:  Positive for difficulty urinating.  Neurological:  Negative for tremors.  Psychiatric/Behavioral:  The patient is nervous/anxious.     Medications: I have reviewed the patient's current medications.  Current Outpatient Medications  Medication Sig Dispense Refill   LORazepam  (ATIVAN ) 0.5 MG tablet Take 1 tablet (0.5 mg total) by mouth every 8 (eight) hours. 90 tablet 1   metoprolol  succinate (TOPROL -XL) 50 MG 24 hr tablet TAKE 1 TABLET BY MOUTH DAILY. TAKE WITH OR IMMEDIATELY FOLLOWING A MEAL. (Patient not taking: Reported on 08/26/2024)  90 tablet 0   No current facility-administered medications for this visit.    Medication Side Effects: Other: perhaps slight tiredness  Allergies: No Known Allergies  Past Medical History:  Diagnosis Date   Essential hypertension    MIld Tricuspid regurgitation    a. 02/2017 Echo: EF 60-65%, no rwma, nl LA/RA size, nl RV fxn, mild TR.   Palpitations    Raynaud disease     Past Medical History, Surgical history, Social history, and Family history were reviewed and updated as appropriate.   Please see review of systems for further details on the patient's review from today.   Objective:   Physical Exam:  There were no vitals taken for this visit.  Physical Exam Constitutional:      General: He is not in acute distress. Musculoskeletal:        General: No deformity.  Neurological:     Mental Status: He is alert and oriented to person, place, and time.     Coordination: Coordination normal.  Psychiatric:        Attention and Perception: Attention and perception normal. He does not perceive auditory or visual hallucinations.        Mood and Affect: Mood is anxious. Mood is not depressed. Affect is not labile or inappropriate.        Speech: Speech normal. Speech is not slurred.        Behavior: Behavior normal.        Thought Content: Thought content normal. Thought content is not paranoid or delusional. Thought content does not include homicidal or suicidal ideation. Thought content does not include suicidal plan.        Cognition and Memory: Cognition and memory normal.        Judgment: Judgment normal.     Comments: Insight intact Anxiety manageable with just lorazepam .     Lab Review:     Component Value Date/Time   NA 139 06/14/2022 1450   NA 141 12/13/2017 1043   K 4.2 06/14/2022 1450   CL 102 06/14/2022 1450   CO2 28 06/14/2022 1450   GLUCOSE 88 06/14/2022 1450   BUN 16 06/14/2022 1450   BUN 14 12/13/2017 1043   CREATININE 1.15 06/14/2022 1450   CREATININE  1.13 02/16/2017 1028   CALCIUM 9.8 06/14/2022 1450   PROT 7.4 06/14/2022 1450  ALBUMIN 5.1 06/14/2022 1450   AST 20 06/14/2022 1450   ALT 14 06/14/2022 1450   ALKPHOS 37 (L) 06/14/2022 1450   BILITOT 0.9 06/14/2022 1450   GFRNONAA 77 12/13/2017 1043   GFRAA 89 12/13/2017 1043       Component Value Date/Time   WBC 5.8 06/14/2022 1450   RBC 5.41 06/14/2022 1450   HGB 15.9 06/14/2022 1450   HCT 47.1 06/14/2022 1450   PLT 197.0 06/14/2022 1450   MCV 87.0 06/14/2022 1450   MCH 29.1 01/25/2017 1145   MCHC 33.7 06/14/2022 1450   RDW 13.2 06/14/2022 1450   LYMPHSABS 2.0 06/14/2022 1450   MONOABS 0.5 06/14/2022 1450   EOSABS 0.1 06/14/2022 1450   BASOSABS 0.0 06/14/2022 1450    No results found for: POCLITH, LITHIUM   No results found for: PHENYTOIN, PHENOBARB, VALPROATE, CBMZ   .res Assessment: Plan:    Jayjay Littles was seen today for follow-up and anxiety.  Diagnoses and all orders for this visit:  Generalized anxiety disorder  Insomnia due to mental condition   30-minute face to face time with patient . We discussed  generalized anxiety disorder .  He does not consciously feel anxious as noted.  However the somatic symptoms that are consistent with anxiety improved with the lorazepam  and now with Auvelity .   He  is sleeping better then in a long time.    Extensive discussion of latest data on risks dementia from untreated anxiety and insomnia.  We discussed the short-term risks associated with benzodiazepines including sedation and increased fall risk among others.  Discussed long-term side effect risk including dependence, potential withdrawal symptoms, and the potential eventual dose-related risk of dementia.  But recent studies from 2020 dispute this association between benzodiazepines and dementia risk. Newer studies in 2020 do not support an association with dementia. More somatic anxiety without low dose lorazepam .  Clear benefit with lorazepam   0.5 mg TID but able to reduce to just 0.5 mg PM.  stopped metoprolol  ER 50 for anxiety with benefit but can't increase DT tiredness.   Disc alternative propranolol.  Previously Disc Genesight testing in detail and what it says and doesn't say.  Disc this again .  SS on serotonin transporter and increased risk SE with SSRIs. Option viiibryd 10  Plan:  Lorazepam  only med without SE.  No changes per his request.  Follow-up 12  mos  Lorene Macintosh MD, DFAPA   Please see After Visit Summary for patient specific instructions.  No future appointments.    No orders of the defined types were placed in this encounter.    -------------------------------

## 2024-11-14 ENCOUNTER — Other Ambulatory Visit: Payer: Self-pay

## 2024-11-14 ENCOUNTER — Telehealth: Payer: Self-pay | Admitting: Psychiatry

## 2024-11-14 DIAGNOSIS — F411 Generalized anxiety disorder: Secondary | ICD-10-CM

## 2024-11-14 DIAGNOSIS — F5105 Insomnia due to other mental disorder: Secondary | ICD-10-CM

## 2024-11-14 MED ORDER — LORAZEPAM 0.5 MG PO TABS: 0.5000 mg | ORAL_TABLET | Freq: Three times a day (TID) | ORAL | 0 refills | Status: AC

## 2024-11-14 NOTE — Telephone Encounter (Signed)
 Jonathan Mcguire called and said that he is out of town and forgot his lorazapam. He needs 5 days worth [facility] located at [address], [place] [address]

## 2024-11-14 NOTE — Telephone Encounter (Signed)
 Pended

## 2025-08-27 ENCOUNTER — Ambulatory Visit: Admitting: Psychiatry
